# Patient Record
Sex: Female | Born: 1950
Health system: Southern US, Community
[De-identification: ages and names within clinical notes are randomized; demographics above are authoritative.]

## PROBLEM LIST (undated history)

## (undated) DIAGNOSIS — R569 Unspecified convulsions: Secondary | ICD-10-CM

## (undated) DIAGNOSIS — A389 Scarlet fever, uncomplicated: Secondary | ICD-10-CM

## (undated) DIAGNOSIS — F329 Major depressive disorder, single episode, unspecified: Secondary | ICD-10-CM

## (undated) DIAGNOSIS — I10 Essential (primary) hypertension: Secondary | ICD-10-CM

## (undated) DIAGNOSIS — Z8669 Personal history of other diseases of the nervous system and sense organs: Secondary | ICD-10-CM

## (undated) DIAGNOSIS — F32A Depression, unspecified: Secondary | ICD-10-CM

## (undated) DIAGNOSIS — T7840XA Allergy, unspecified, initial encounter: Secondary | ICD-10-CM

## (undated) DIAGNOSIS — M1990 Unspecified osteoarthritis, unspecified site: Secondary | ICD-10-CM

## (undated) DIAGNOSIS — H9313 Tinnitus, bilateral: Secondary | ICD-10-CM

## (undated) DIAGNOSIS — M797 Fibromyalgia: Secondary | ICD-10-CM

## (undated) HISTORY — PX: TONSILLECTOMY: SUR1361

## (undated) HISTORY — DX: Personal history of other diseases of the nervous system and sense organs: Z86.69

## (undated) HISTORY — DX: Major depressive disorder, single episode, unspecified: F32.9

## (undated) HISTORY — DX: Unspecified osteoarthritis, unspecified site: M19.90

## (undated) HISTORY — DX: Unspecified convulsions: R56.9

## (undated) HISTORY — DX: Essential (primary) hypertension: I10

## (undated) HISTORY — DX: Allergy, unspecified, initial encounter: T78.40XA

## (undated) HISTORY — DX: Tinnitus, bilateral: H93.13

## (undated) HISTORY — PX: BILATERAL CARPAL TUNNEL RELEASE: SHX6508

## (undated) HISTORY — DX: Scarlet fever, uncomplicated: A38.9

## (undated) HISTORY — PX: COLONOSCOPY: SHX174

## (undated) HISTORY — DX: Depression, unspecified: F32.A

## (undated) HISTORY — DX: Fibromyalgia: M79.7

## (undated) HISTORY — PX: FOOT SURGERY: SHX648

---

## 1994-08-03 HISTORY — PX: DENTAL SURGERY: SHX609

## 1999-06-05 ENCOUNTER — Ambulatory Visit (HOSPITAL_COMMUNITY): Admission: RE | Admit: 1999-06-05 | Discharge: 1999-06-05 | Payer: Self-pay | Admitting: Unknown Physician Specialty

## 1999-06-05 ENCOUNTER — Encounter: Payer: Self-pay | Admitting: Unknown Physician Specialty

## 2000-05-05 ENCOUNTER — Other Ambulatory Visit: Admission: RE | Admit: 2000-05-05 | Discharge: 2000-05-05 | Payer: Self-pay | Admitting: Obstetrics & Gynecology

## 2000-06-14 ENCOUNTER — Encounter: Payer: Self-pay | Admitting: Obstetrics & Gynecology

## 2000-06-14 ENCOUNTER — Encounter: Admission: RE | Admit: 2000-06-14 | Discharge: 2000-06-14 | Payer: Self-pay | Admitting: Obstetrics & Gynecology

## 2000-07-09 ENCOUNTER — Ambulatory Visit (HOSPITAL_COMMUNITY): Admission: RE | Admit: 2000-07-09 | Discharge: 2000-07-09 | Payer: Self-pay

## 2000-07-09 ENCOUNTER — Encounter: Payer: Self-pay | Admitting: Family Medicine

## 2000-08-03 HISTORY — PX: NOSE SURGERY: SHX723

## 2001-06-07 ENCOUNTER — Other Ambulatory Visit: Admission: RE | Admit: 2001-06-07 | Discharge: 2001-06-07 | Payer: Self-pay | Admitting: Obstetrics and Gynecology

## 2001-08-24 ENCOUNTER — Encounter: Admission: RE | Admit: 2001-08-24 | Discharge: 2001-10-03 | Payer: Self-pay | Admitting: Specialist

## 2002-07-03 ENCOUNTER — Other Ambulatory Visit: Admission: RE | Admit: 2002-07-03 | Discharge: 2002-07-03 | Payer: Self-pay | Admitting: Obstetrics and Gynecology

## 2003-05-17 ENCOUNTER — Ambulatory Visit (HOSPITAL_COMMUNITY): Admission: RE | Admit: 2003-05-17 | Discharge: 2003-05-17 | Payer: Self-pay | Admitting: Family Medicine

## 2003-05-17 ENCOUNTER — Encounter: Payer: Self-pay | Admitting: Family Medicine

## 2004-11-04 ENCOUNTER — Ambulatory Visit (HOSPITAL_COMMUNITY): Admission: RE | Admit: 2004-11-04 | Discharge: 2004-11-04 | Payer: Self-pay | Admitting: Family Medicine

## 2005-12-24 ENCOUNTER — Ambulatory Visit (HOSPITAL_COMMUNITY): Admission: RE | Admit: 2005-12-24 | Discharge: 2005-12-24 | Payer: Self-pay | Admitting: Family Medicine

## 2006-01-12 ENCOUNTER — Encounter: Admission: RE | Admit: 2006-01-12 | Discharge: 2006-01-12 | Payer: Self-pay | Admitting: Family Medicine

## 2006-01-18 ENCOUNTER — Ambulatory Visit: Payer: Self-pay | Admitting: Gastroenterology

## 2006-01-22 ENCOUNTER — Ambulatory Visit: Payer: Self-pay | Admitting: Gastroenterology

## 2006-01-27 ENCOUNTER — Other Ambulatory Visit: Admission: RE | Admit: 2006-01-27 | Discharge: 2006-01-27 | Payer: Self-pay | Admitting: Obstetrics and Gynecology

## 2008-01-02 ENCOUNTER — Ambulatory Visit (HOSPITAL_BASED_OUTPATIENT_CLINIC_OR_DEPARTMENT_OTHER): Admission: RE | Admit: 2008-01-02 | Discharge: 2008-01-02 | Payer: Self-pay | Admitting: Obstetrics and Gynecology

## 2008-01-18 ENCOUNTER — Other Ambulatory Visit: Admission: RE | Admit: 2008-01-18 | Discharge: 2008-01-18 | Payer: Self-pay | Admitting: Obstetrics and Gynecology

## 2009-06-24 ENCOUNTER — Ambulatory Visit: Payer: Self-pay | Admitting: Diagnostic Radiology

## 2009-06-24 ENCOUNTER — Ambulatory Visit (HOSPITAL_BASED_OUTPATIENT_CLINIC_OR_DEPARTMENT_OTHER): Admission: RE | Admit: 2009-06-24 | Discharge: 2009-06-24 | Payer: Self-pay | Admitting: Family Medicine

## 2011-01-06 ENCOUNTER — Encounter: Payer: Self-pay | Admitting: Gastroenterology

## 2011-05-20 ENCOUNTER — Other Ambulatory Visit (HOSPITAL_BASED_OUTPATIENT_CLINIC_OR_DEPARTMENT_OTHER): Payer: Self-pay | Admitting: Family Medicine

## 2011-05-20 DIAGNOSIS — Z1231 Encounter for screening mammogram for malignant neoplasm of breast: Secondary | ICD-10-CM

## 2011-05-22 ENCOUNTER — Encounter: Payer: Self-pay | Admitting: Gastroenterology

## 2011-05-22 ENCOUNTER — Ambulatory Visit (AMBULATORY_SURGERY_CENTER): Payer: Medicare Other | Admitting: *Deleted

## 2011-05-22 VITALS — Ht 59.25 in | Wt 176.0 lb

## 2011-05-22 DIAGNOSIS — Z1211 Encounter for screening for malignant neoplasm of colon: Secondary | ICD-10-CM

## 2011-05-22 MED ORDER — PEG-KCL-NACL-NASULF-NA ASC-C 100 G PO SOLR: ORAL | Status: DC

## 2011-05-25 ENCOUNTER — Ambulatory Visit (HOSPITAL_BASED_OUTPATIENT_CLINIC_OR_DEPARTMENT_OTHER)
Admission: RE | Admit: 2011-05-25 | Discharge: 2011-05-25 | Disposition: A | Discharge status: Home / Self Care | Payer: Medicare Other | Source: Ambulatory Visit | Attending: Family Medicine | Admitting: Family Medicine

## 2011-05-25 DIAGNOSIS — Z1231 Encounter for screening mammogram for malignant neoplasm of breast: Secondary | ICD-10-CM | POA: Insufficient documentation

## 2011-06-04 ENCOUNTER — Other Ambulatory Visit: Payer: Self-pay | Admitting: Gastroenterology

## 2011-06-15 ENCOUNTER — Ambulatory Visit (AMBULATORY_SURGERY_CENTER): Payer: Medicare Other | Admitting: Gastroenterology

## 2011-06-15 ENCOUNTER — Encounter: Payer: Self-pay | Admitting: Gastroenterology

## 2011-06-15 VITALS — BP 123/72 | HR 67 | Temp 97.3°F | Resp 16 | Ht 59.0 in | Wt 176.0 lb

## 2011-06-15 DIAGNOSIS — K649 Unspecified hemorrhoids: Secondary | ICD-10-CM

## 2011-06-15 DIAGNOSIS — K921 Melena: Secondary | ICD-10-CM

## 2011-06-15 DIAGNOSIS — Z1211 Encounter for screening for malignant neoplasm of colon: Secondary | ICD-10-CM

## 2011-06-15 DIAGNOSIS — Z86010 Personal history of colonic polyps: Secondary | ICD-10-CM

## 2011-06-15 MED ORDER — SODIUM CHLORIDE 0.9 % IV SOLN: 500.0000 mL | INTRAVENOUS | Status: DC

## 2011-06-15 NOTE — Progress Notes (Signed)
No complaints noted in the recovery room. maw 

## 2011-06-15 NOTE — Patient Instructions (Signed)
See the picture page for your findings from your exam today.  Follow the green and blue discharge instruction sheets the rest of the day.  Resume your prior medications today. Please call if any questions or concerns.  

## 2011-06-16 ENCOUNTER — Telehealth: Payer: Self-pay

## 2011-06-16 NOTE — Telephone Encounter (Signed)

## 2012-05-12 ENCOUNTER — Emergency Department: Payer: Self-pay | Admitting: Emergency Medicine

## 2012-05-12 LAB — CBC WITH DIFFERENTIAL/PLATELET
Basophil #: 0.1 10*3/uL (ref 0.0–0.1)
Eosinophil %: 5.9 %
HCT: 41.5 % (ref 35.0–47.0)
Lymphocyte #: 2 10*3/uL (ref 1.0–3.6)
MCH: 31.7 pg (ref 26.0–34.0)
MCV: 90 fL (ref 80–100)
Monocyte #: 0.7 x10 3/mm (ref 0.2–0.9)
Monocyte %: 8 %
RDW: 13.2 % (ref 11.5–14.5)
WBC: 8.1 10*3/uL (ref 3.6–11.0)

## 2012-05-12 LAB — BASIC METABOLIC PANEL
Anion Gap: 10 (ref 7–16)
BUN: 9 mg/dL (ref 7–18)
Chloride: 107 mmol/L (ref 98–107)
Creatinine: 0.79 mg/dL (ref 0.60–1.30)
EGFR (Non-African Amer.): >60
Potassium: 3.4 mmol/L — ABNORMAL LOW (ref 3.5–5.1)

## 2012-10-25 ENCOUNTER — Ambulatory Visit: Payer: Self-pay | Admitting: Orthopedic Surgery

## 2012-11-21 ENCOUNTER — Ambulatory Visit: Payer: Self-pay | Admitting: Orthopedic Surgery

## 2012-11-21 LAB — MRSA PCR SCREENING

## 2012-11-21 LAB — SEDIMENTATION RATE: Erythrocyte Sed Rate: 21 mm/hr (ref 0–30)

## 2012-12-06 ENCOUNTER — Inpatient Hospital Stay: Payer: Self-pay | Admitting: Nurse Practitioner

## 2012-12-07 LAB — HEMOGLOBIN: HGB: 10.6 g/dL — ABNORMAL LOW (ref 12.0–16.0)

## 2012-12-07 LAB — BASIC METABOLIC PANEL
Anion Gap: 5 — ABNORMAL LOW (ref 7–16)
Chloride: 106 mmol/L (ref 98–107)
EGFR (African American): >60
EGFR (Non-African Amer.): >60
Osmolality: 277 (ref 275–301)
Potassium: 3.2 mmol/L — ABNORMAL LOW (ref 3.5–5.1)

## 2012-12-08 LAB — PATHOLOGY REPORT

## 2012-12-10 LAB — BASIC METABOLIC PANEL
BUN: 8 mg/dL (ref 7–18)
Calcium, Total: 8.6 mg/dL (ref 8.5–10.1)
Chloride: 115 mmol/L — ABNORMAL HIGH (ref 98–107)
Co2: 22 mmol/L (ref 21–32)
Creatinine: 0.58 mg/dL — ABNORMAL LOW (ref 0.60–1.30)
EGFR (African American): >60
EGFR (Non-African Amer.): >60
Glucose: 118 mg/dL — ABNORMAL HIGH (ref 65–99)
Osmolality: 286 (ref 275–301)

## 2013-05-24 ENCOUNTER — Other Ambulatory Visit (HOSPITAL_BASED_OUTPATIENT_CLINIC_OR_DEPARTMENT_OTHER): Payer: Self-pay | Admitting: Family Medicine

## 2013-05-24 DIAGNOSIS — Z1231 Encounter for screening mammogram for malignant neoplasm of breast: Secondary | ICD-10-CM

## 2013-05-29 ENCOUNTER — Ambulatory Visit (HOSPITAL_BASED_OUTPATIENT_CLINIC_OR_DEPARTMENT_OTHER)
Admission: RE | Admit: 2013-05-29 | Discharge: 2013-05-29 | Disposition: A | Discharge status: Home / Self Care | Payer: Medicare Other | Source: Ambulatory Visit | Attending: Family Medicine | Admitting: Family Medicine

## 2013-05-29 DIAGNOSIS — Z1231 Encounter for screening mammogram for malignant neoplasm of breast: Secondary | ICD-10-CM | POA: Insufficient documentation

## 2013-06-01 ENCOUNTER — Ambulatory Visit: Payer: Self-pay | Admitting: Orthopedic Surgery

## 2013-06-20 ENCOUNTER — Ambulatory Visit: Payer: Self-pay | Admitting: Orthopedic Surgery

## 2013-06-20 LAB — URINALYSIS, COMPLETE
Bacteria: NONE SEEN
Bilirubin,UR: NEGATIVE
Blood: NEGATIVE
Glucose,UR: NEGATIVE mg/dL (ref 0–75)
Leukocyte Esterase: NEGATIVE
Ph: 6 (ref 4.5–8.0)
Squamous Epithelial: <1

## 2013-06-20 LAB — CBC
HCT: 41.5 % (ref 35.0–47.0)
HGB: 14.5 g/dL (ref 12.0–16.0)
MCH: 29.9 pg (ref 26.0–34.0)
RDW: 14.2 % (ref 11.5–14.5)
WBC: 11.1 10*3/uL — ABNORMAL HIGH (ref 3.6–11.0)

## 2013-06-20 LAB — BASIC METABOLIC PANEL
Anion Gap: 6 — ABNORMAL LOW (ref 7–16)
Calcium, Total: 9.3 mg/dL (ref 8.5–10.1)
Chloride: 105 mmol/L (ref 98–107)
Creatinine: 0.98 mg/dL (ref 0.60–1.30)
EGFR (Non-African Amer.): >60
Glucose: 106 mg/dL — ABNORMAL HIGH (ref 65–99)
Potassium: 3.4 mmol/L — ABNORMAL LOW (ref 3.5–5.1)
Sodium: 138 mmol/L (ref 136–145)

## 2013-06-20 LAB — PROTIME-INR
INR: 1.1
Prothrombin Time: 14.1 secs (ref 11.5–14.7)

## 2013-06-20 LAB — APTT: Activated PTT: 38.8 secs — ABNORMAL HIGH (ref 23.6–35.9)

## 2013-06-20 LAB — SEDIMENTATION RATE: Erythrocyte Sed Rate: 15 mm/hr (ref 0–30)

## 2013-06-20 LAB — MRSA PCR SCREENING

## 2013-07-04 ENCOUNTER — Inpatient Hospital Stay: Payer: Self-pay | Admitting: Orthopedic Surgery

## 2013-07-05 LAB — BASIC METABOLIC PANEL
Anion Gap: 7 (ref 7–16)
Calcium, Total: 8.1 mg/dL — ABNORMAL LOW (ref 8.5–10.1)
Co2: 25 mmol/L (ref 21–32)
Creatinine: 0.92 mg/dL (ref 0.60–1.30)
EGFR (African American): >60
EGFR (Non-African Amer.): >60
Potassium: 3.1 mmol/L — ABNORMAL LOW (ref 3.5–5.1)
Sodium: 140 mmol/L (ref 136–145)

## 2013-07-05 LAB — PLATELET COUNT: Platelet: 155 10*3/uL (ref 150–440)

## 2013-07-05 LAB — HEMOGLOBIN: HGB: 11.7 g/dL — ABNORMAL LOW (ref 12.0–16.0)

## 2013-07-06 LAB — PATHOLOGY REPORT

## 2013-08-03 HISTORY — PX: REPLACEMENT TOTAL KNEE: SUR1224

## 2014-04-06 ENCOUNTER — Encounter: Payer: Self-pay | Admitting: Gastroenterology

## 2014-06-28 IMAGING — CR DG KNEE 1-2V*L*
1 series · 1 of 1 positions shown · non-contrast
Comparison: CT left knee 06/01/2013.

CLINICAL DATA: Prior knee replacement.

EXAM:
LEFT KNEE - 1-2 VIEW

[lat]
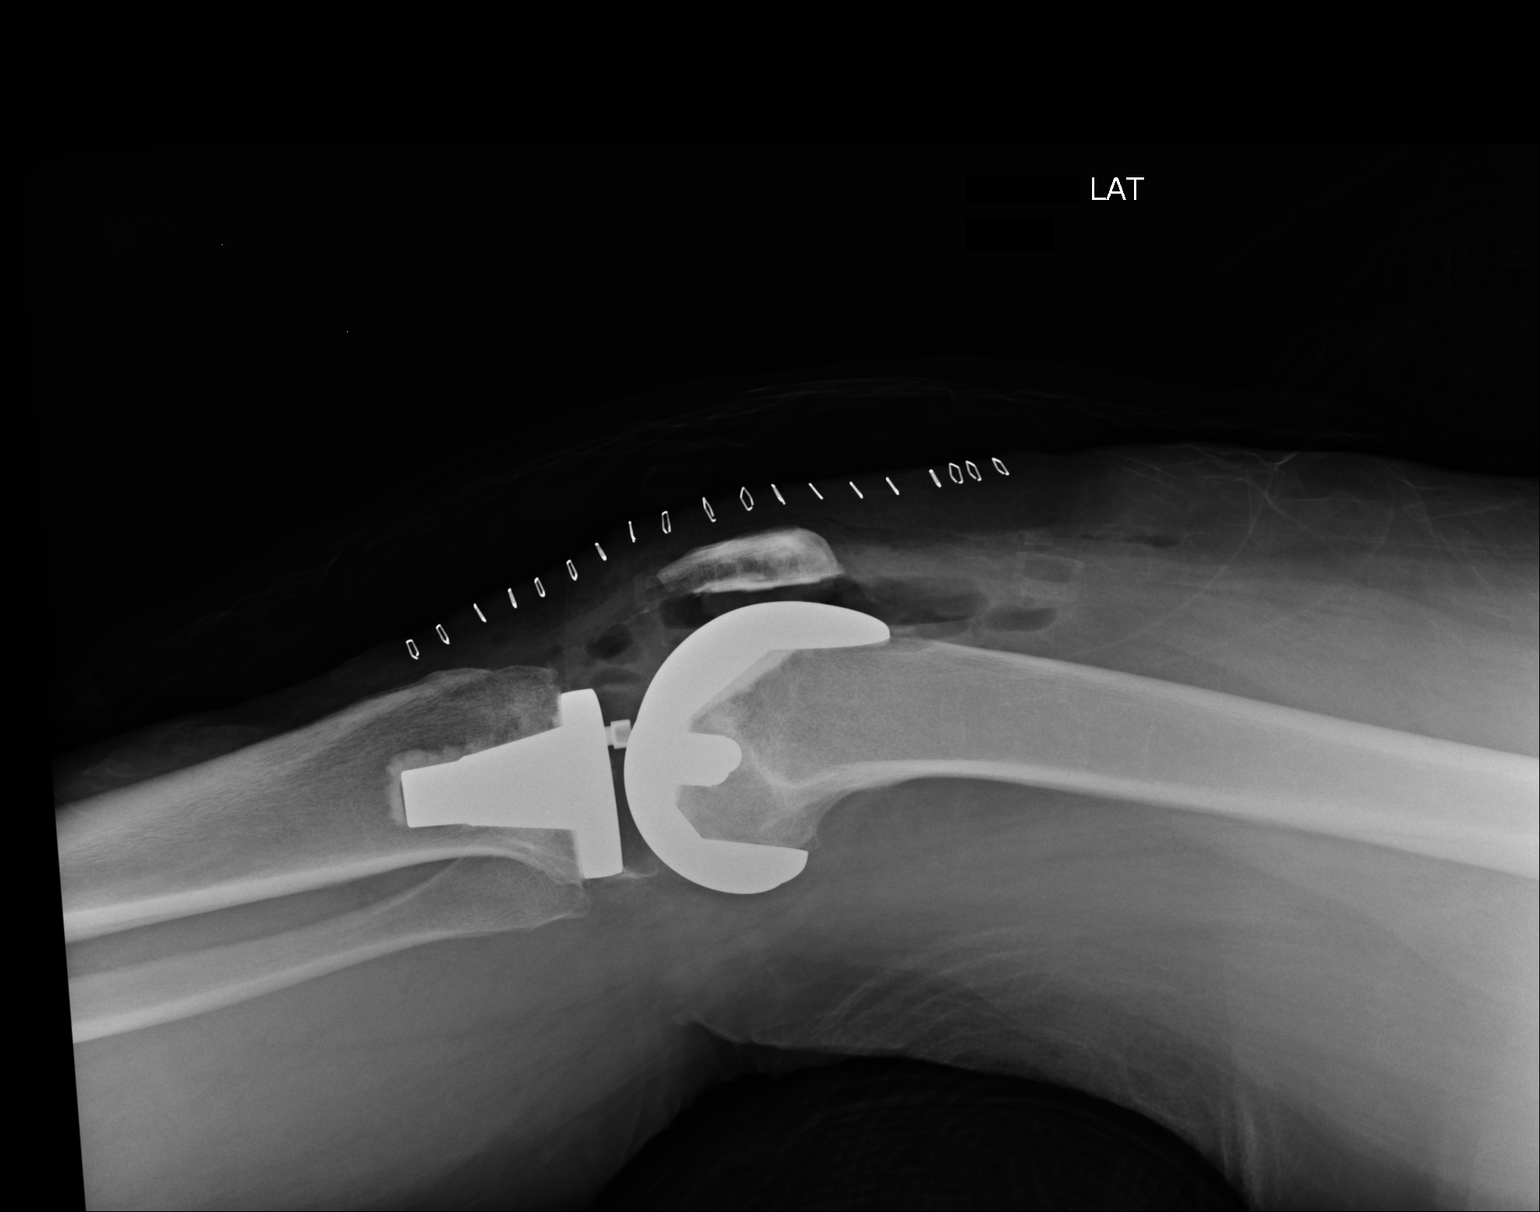

[1 of 1 positions shown; findings below may reference images not displayed]

FINDINGS: Patient is status post total left knee replacement with good
anatomic alignment. Surgical staples are noted. Air in soft tissues
noted from prior surgery.
IMPRESSION: Patient status post total left knee replacement with good anatomic
alignment.

## 2014-11-23 NOTE — Op Note (Signed)
PATIENT NAME:  Toni Bell, SCHLINK MR#:  [id number] DATE OF BIRTH:  07/19/1951  DATE OF PROCEDURE:  12/06/2012  PREOPERATIVE DIAGNOSIS: Right knee osteoarthritis.   POSTOPERATIVE DIAGNOSIS: Right knee osteoarthritis.   PROCEDURE: Right total knee replacement.   SURGEON: Laurene Footman, MD   ASSISTANT: April Berndt, Nurse Practitioner   ANESTHESIA: Spinal.   DESCRIPTION OF PROCEDURE: The patient was brought to the operating room, and after adequate spinal anesthesia was obtained, the right leg was prepped and draped in the usual sterile fashion with a tourniquet applied to the upper thigh. After patient identification and timeout procedures were performed, the leg was exsanguinated with an Esmarch and the tourniquet raised to 300 mmHg. A midline skin incision was made followed by a medial parapatellar arthrotomy. Inspection revealed significant medial and patellofemoral degenerative change with some exposed bone.  The lateral compartment had pitting of articular cartilage. The ACL and infrapatellar fat pad were excised. Next, the proximal tibia was exposed for application of the [place] cutting guide.  The proximal cutting guide was applied and alignment checked and  proximal tibia cut carried out along with excision of the menisci at this time. The femur was addressed in a similar fashion with removing articular cartilage first around the periphery to allow for application of the cutting guide.  The guide was applied.  The distal cut was made with drill holes to give correct femoral rotation.  The 5-in-1cutting block was applied to the distal femur, anterior, posterior and chamfer cuts carried out. The trials were placed, and the #1 fit well for the tibia, size 2 on the femur. The 10 mm spacer was initially applied with subsequently going up to a 12 to get good stability in mid flexion. After acceptable position on trials had been obtained, drill holes in the distal femur and notch cut for the trochlear  groove were applied.  Proximal drilling of the tibia and notch cut for rotation had also been carried out with the initial trial placement.  The patella was cut using the guide, and it sized to a #1 after drill 3 pegs were placed. At this point, the trial components were removed. The bony surface was thoroughly irrigated and dried prior to implantation of the cement.  Exparel diluted with saline was infiltrated around the joint. Next, the tibial component was cemented into place first, followed by the tibial  insert and placement of the femoral component.  The knee was then placed in extension and the patellar button clamped into place. After the cement had set, excess cement was removed. The tourniquet was let down.  The posterior geniculate artery had bleeding which could not be completely controlled with the implants in place, and so it was determined a wound VAC would be required to help minimize postop swelling. The wound was closed with a heavy quill suture for the capsule and 2-0 quill subcutaneously. The skin was closed with staples. Following this, Mepitel and wound VAC were applied. The patient was then sent to the recovery room in stable condition with a Polar Care device applied.   CONDITION TO RECOVERY ROOM: Stable.   ESTIMATED BLOOD LOSS: 600.   URINE OUTPUT: 100.   TOURNIQUET TIME: 70 minutes at 300 mmHg.   IMPLANTS: GMK primary femoral component, size right 2N, a size 1 right fixed tibial tray with a size 1 patella and a size 1, 12 mm UC tibial insert.   SPECIMENS: Cut ends of bone.   ____________________________ Laurene Footman, MD mjm:cb  D: 12/06/2012 17:41:10 ET T: 12/06/2012 20:07:59 ET JOB#: [id number]  cc: Laurene Footman, MD, <Dictator> Laurene Footman MD ELECTRONICALLY SIGNED 12/07/2012 8:06

## 2014-11-23 NOTE — Op Note (Signed)
PATIENT NAME:  Toni Bell, Toni Bell MR#:  [id number] DATE OF BIRTH:  [birth year]  DATE OF PROCEDURE:  07/04/2013  PREOPERATIVE DIAGNOSIS:  Left knee osteoarthritis.   POSTOPERATIVE DIAGNOSIS:  Left knee osteoarthritis.   PROCEDURE:  Left total knee replacement.   ANESTHESIA:  General.  SURGEON: Hessie Knows, M.D.  ASSISTANT:  Rachelle Hora, PA-C.   DESCRIPTION OF PROCEDURE: The patient was brought to the operating room and after adequate anesthesia was obtained, the left leg was prepped and draped in the usual sterile fashion with a tourniquet applied to the upper thigh. After patient identification and timeout procedures were completed, the leg was exsanguinated with an Esmarch and the tourniquet raised to 300 mmHg. A midline skin incision was made, followed by a medial parapatellar arthrotomy. Inspection of the knee revealed moderate synovitis. There is eburnated bone present on the lateral patellar facet and exposed bone on the corresponding lateral femoral trochlea with extensive spurring. There is exposed bone on the medial femoral condyle and tibial condyles. The lateral compartment had pitting of the articular cartilage. After the fat pad and ACL were excised, the proximal tibia was exposed to allow for application of the [place] cutting block. The [place] cutting block was applied and alignment checked and the proximal tibia cut carried out with bone removed. The cut matched the preop template.   Next, the femur was exposed to allow for application of the [place] femoral cutting guide with pins placed. The distal femoral cut was carried out and a 2 femoral cutting jig was applied. Anterior, posterior and chamfer cuts made. At this point, the residual horns of the menisci were excised along with some posterior spurs. The tibia sized to a size 1 and the tibial trial was placed. Rotation checked based on one of the initial pins placed from the block. It was pinned in place, proximal drill hole carried  out followed by the keel cut and the keel punch was left in place. The femoral trial was placed and with a 10 mm insert, there was full extension. Distal femoral drill holes were then made and the trochlear cut is then carried out with a trochlear cutting guide. The patella was cut using the patellar cutting guide and after the drill holes were made, it sized to a size 1. At this point, the knee was thoroughly irrigated. Hemostasis checked with electrocautery. The knee was injected with Exparel diluted with saline in the periarticular tissues along with a combination of morphine and 0.25% Sensorcaine with epinephrine. Following this, the bony surfaces were dried and the final components placed for cementing the tibial component in place, placing the patellar insert with locking screw, followed by the femoral component. The knee was held in extension and the patellar button was clamped into place with all components cemented. After the cement had set, excess cement was removed. There was excellent patellofemoral tracking. The knee was again thoroughly irrigated and the tourniquet let down. The arthrotomy was repaired using a heavy Quill suture followed by 2-0 Quill subcutaneously and skin staples. Xeroform, 4 x 4's, Webril and Ace wrap applied along with Polar Care and knee immobilizer. The patient was sent to the recovery room in stable condition.   ESTIMATED BLOOD LOSS:  50 mL.   COMPLICATIONS:  None.   SPECIMEN:  Cut ends of bone.  TOURNIQUET TIME:  57 minutes at 300 mmHg.  IMPLANTS:  Medacta GMK sphere, size 2 femur, 1 tibia, 10 mm insert with a size 1 patella.   ____________________________ Legrand Como  Arnell Asal, MD mjm:ce D: 07/04/2013 22:53:48 ET T: 07/04/2013 23:38:12 ET JOB#: [id number]  cc: Laurene Footman, MD, <Dictator> Laurene Footman MD ELECTRONICALLY SIGNED 07/05/2013 8:13

## 2014-11-23 NOTE — Discharge Summary (Signed)
PATIENT NAME:  Toni Bell, Toni Bell MR#:  [id number] DATE OF BIRTH:  [birth year]  DATE OF ADMISSION:  07/04/2013 DATE OF DISCHARGE:  07/07/2013  ADMITTING DIAGNOSIS: Degenerative arthritis, left knee.   DISCHARGE DIAGNOSIS: Degenerative arthritis, left knee.   OPERATION: On 07/04/2013, she had a left total knee arthroplasty.   ANESTHESIA: General.   ESTIMATED BLOOD LOSS: 50 mL.  TOURNIQUET TIME: 57 minutes at 300 mmHg.   DRAINS: None.   IMPLANTS: Medacta GMK sphere, 2 femur, 1 tibia, 10 mm insert, 1 patella.   COMPLICATIONS: None.   HISTORY: Toni Bell is a 64 year old female who failed conservative measures and treatment for her left knee osteoarthritis. She consented for an elective left total knee arthroplasty.   PHYSICAL EXAMINATION:  LUNGS: Clear.  HEART: Regular rate and rhythm.  HEENT: Normal.  MUSCULOSKELETAL: Left knee range of motion 25 to 105. NEUROVASCULAR: Intact.  HOSPITAL COURSE: After initial admission on 07/04/2013 the patient underwent a left total knee arthroplasty the same day. She had good pain control afterwards and was then transferred to the orthopedic floor from the PACU. Her initial hemoglobin was 11.7. She was hypokalemic at 3.1. 40 mEq of potassium was given IV that day and a potassium was rechecked later that day, which was 3.8. She began physical therapy on that day with slow progress. On postoperative day two, 07/06/2013, she continued to have small progress with physical therapy. On postoperative day three, 07/07/2013, she was stable and ready for discharge. She ambulated 200 feet with physical therapy the day before and had a bowel movement.   CONDITION AT DISCHARGE: Stable.   DISPOSITION: The patient is sent home with home health.   DISCHARGE INSTRUCTIONS: The patient will follow up in [facility] orthopedics in 2 weeks for staple removal. She will do physical therapy and weight bear as tolerated on the left lower extremity. She will have a regular  diet. TED hose thigh high bilaterally. She has an Aquacel dressing placed which should not be changed until she comes for her follow-up appointment.   DISCHARGE MEDICATIONS: Please see discharge instructions for a complete list of discharge medications. ____________________________ Timera Windt M. Tretha Sciara, NP amb:sb D: 07/07/2013 08:04:16 ET T: 07/07/2013 08:41:35 ET JOB#: [id number]  cc: Seairra Otani M. Tretha Sciara, NP, <Dictator> Kem Kays Ishani Goldwasser FNP ELECTRONICALLY SIGNED 07/10/2013 8:25

## 2014-11-23 NOTE — Discharge Summary (Signed)
PATIENT NAME:  Toni Bell, Toni Bell MR#:  [id number] DATE OF BIRTH:  [birth year]  DATE OF ADMISSION:  12/06/2012 DATE OF DISCHARGE:  12/09/2012  ADMITTING DIAGNOSIS: Right knee osteoarthritis.   POSTOPERATIVE DIAGNOSIS: Right knee osteoarthritis.   OPERATION: On 12/06/2012, she had a right total knee arthroplasty.   SURGEON: Hessie Knows, M.D.   ANESTHESIA: Spinal.   ESTIMATED BLOOD LOSS: 600 mL.  URINE OUTPUT: 100 mL.  TOURNIQUET TIME: 70 minutes at 300 mmHg.   IMPLANTS: GMK primary femoral component size 2N, size 1 right fixed tibial tray with size 1 patella and size 1, 12 mm UC tibial insert.  COMPLICATIONS: None.   HISTORY: Ms. Fernandez is a 64 year old who has exhausted nonoperative management with injection, bracing and anti-inflammatories without relief and consented for a total knee arthroplasty.   PHYSICAL EXAMINATION: HEENT: Remarkable for upper partial dentures.  LUNGS: Clear to auscultation.  HEART: Regular rhythm without murmur.  RIGHT KNEE:  Range of motion 5 to 100 with varus deformity that is passively correctable. Moderate swelling to the knee with a Baker's cyst present. Distally has palpable dorsal pedis and posterior tibialis pulses. No clonus, cyanosis or edema. She is able to flex and extend the toes and ankle.   HOSPITAL COURSE: After admission, on 12/06/2012, she underwent right total knee arthroplasty the same day. Her pain was controlled and she was brought to the orthopedic floor from the PACU. Her initial hemoglobin on postoperative day 1, 12/07/2012, was 10.6. She had a potassium of 3.2.  Her potassium was replaced, but potassium was not available for review on morning rounding, on postoperative day 2, 12/08/2012.  On 12/09/2012, a recent potassium was 2.8.  Her potassium was increased as she was already on 10 milliequivalents daily. Physical therapy was begun on that day. On postoperative day 2, 12/08/2012, she had limited progress with physical therapy. She was  refusing pain meds, stool softener and has shortness of breath while sitting in a recliner. T-max that day was 100.9. She did not have any labs drawn that day.  She is stable for discharge to rehab.   CONDITION AT DISCHARGE: Stable.   DISPOSITION: The patient was sent to rehab.   DISCHARGE INSTRUCTIONS: The patient will follow up with [facility] orthopedics in 2 weeks for staple removal. She will do physical therapy and weight bear as tolerated on the right lower extremity.  Diet is regular. TED hose knee-high bilaterally. Dressing can be changed once daily and on an as-needed basis.   DISCHARGE MEDICATIONS: 1.  Centrum Silver 1 tab p.o. at bedtime.  2.  Calcium carbonate/simethicone 1000/60 mg tablet chewable, 1 tab p.o. once daily p.r.n. 3.  Cephalexin 500 mg capsule 1 cap p.o. t.i.d.  4.  Cyclobenzaprine 10 mg 1 tab p.o. t.i.d. p.r.n. muscle spasm.  5.  Gabapentin 100 mg 3 caps p.o. at bedtime.  6.  Clonazepam 1 mg 1 tab p.o. once daily at bedtime.  7.  Aspirin 81 mg 1 tab p.o. at bedtime.  8.  Bisoprolol/HCTZ 2.5/6.25 mg 1 tab p.o. at bedtime.  9.  Baclofen 10 mg 2 tabs p.o. at bedtime.  10.  Fluoxetine 20 mg oral capsule 1 cap p.o. at bedtime.  11.  Seroquel 100 mg 1-1/2 tabs p.o. at bedtime.  12.  Calcium 600 plus D 1 tab p.o. at bedtime.  13.  Probiotic formula 1 cap p.o. once daily in the morning.  14.  Topamax 100 mg 1 tab p.o. at bedtime.  15.  Allegra 180 mg  1 tab p.o. at bedtime.  16.  Clobetasol topical 0.05% topical cream apply topically to affected area 2 times a day p.r.n. 17.  Sine-Off maximum strength 2 tabs p.o. once daily p.r.n.  18.  Multivitamin with minerals 1 tab p.o. once daily.  19.  Ibuprofen 400 mg every 6 hours p.r.n. pain.  20.  Acetaminophen 500 mg 1 tab p.o. q. 4 hours p.r.n. pain or temperature greater than 100.4.  21.  Oxycodone 5 mg oral tablet 1 tab p.o. q. 4 hours p.r.n. pain.  22.  Sumatriptan 50 mg 1 tab p.o. q. 12 hours p.r.n. headache.  23.   Magnesium hydroxide 8% oral suspension 30 mL orally b.i.d. p.r.n. constipation.  24.  Rivaroxaban 10 mg oral tablet 1 tab p.o. q. a.m. x 9 days. 25.  Zolpidem 5 mg 1 tab p.o. at bedtime p.r.n. insomnia.  26.  Bisacodyl 10 mg rectal suppository 1 suppository per rectum once daily p.r.n. constipation.  27.  Docusate/senna 50//8.6 mg oral tablet 1 tab p.o. b.i.d.  ____________________________ Chaunta Bejarano M. Tretha Sciara, NP amb:sb D: 12/09/2012 12:26:48 ET T: 12/09/2012 12:58:14 ET JOB#: [id number]  cc: Trinna Kunst M. Tretha Sciara, NP, <Dictator> Kem Kays Keilana Morlock FNP ELECTRONICALLY SIGNED 12/16/2012 14:01

## 2015-03-29 ENCOUNTER — Other Ambulatory Visit (HOSPITAL_BASED_OUTPATIENT_CLINIC_OR_DEPARTMENT_OTHER): Payer: Self-pay | Admitting: Family Medicine

## 2015-03-29 DIAGNOSIS — Z1231 Encounter for screening mammogram for malignant neoplasm of breast: Secondary | ICD-10-CM

## 2015-04-12 ENCOUNTER — Ambulatory Visit (HOSPITAL_BASED_OUTPATIENT_CLINIC_OR_DEPARTMENT_OTHER): Payer: Self-pay

## 2015-12-03 DIAGNOSIS — Z0000 Encounter for general adult medical examination without abnormal findings: Secondary | ICD-10-CM | POA: Diagnosis not present

## 2015-12-10 DIAGNOSIS — E785 Hyperlipidemia, unspecified: Secondary | ICD-10-CM | POA: Diagnosis not present

## 2015-12-10 DIAGNOSIS — Z0000 Encounter for general adult medical examination without abnormal findings: Secondary | ICD-10-CM | POA: Diagnosis not present

## 2015-12-10 DIAGNOSIS — I10 Essential (primary) hypertension: Secondary | ICD-10-CM | POA: Diagnosis not present

## 2016-05-12 ENCOUNTER — Encounter: Payer: Self-pay | Admitting: Gastroenterology

## 2016-05-20 ENCOUNTER — Ambulatory Visit (AMBULATORY_SURGERY_CENTER): Payer: Self-pay | Admitting: *Deleted

## 2016-05-20 ENCOUNTER — Encounter: Payer: Self-pay | Admitting: Gastroenterology

## 2016-05-20 ENCOUNTER — Encounter (INDEPENDENT_AMBULATORY_CARE_PROVIDER_SITE_OTHER): Payer: Self-pay

## 2016-05-20 VITALS — Ht 59.0 in | Wt 175.4 lb

## 2016-05-20 DIAGNOSIS — Z86010 Personal history of colonic polyps: Secondary | ICD-10-CM

## 2016-05-20 MED ORDER — NA SULFATE-K SULFATE-MG SULF 17.5-3.13-1.6 GM/177ML PO SOLN: 1.0000 | Freq: Once | ORAL | 0 refills | Status: AC

## 2016-05-20 NOTE — Progress Notes (Signed)
No allergies to eggs or soy. No problems with anesthesia.  Pt not given Emmi instructions for colonoscopy  No oxygen use  No diet drug use

## 2016-05-27 ENCOUNTER — Encounter: Payer: Self-pay | Admitting: Gastroenterology

## 2016-05-27 ENCOUNTER — Ambulatory Visit (AMBULATORY_SURGERY_CENTER): Payer: PPO | Admitting: Gastroenterology

## 2016-05-27 VITALS — BP 126/66 | HR 65 | Temp 97.8°F | Resp 15 | Ht 59.0 in | Wt 175.0 lb

## 2016-05-27 DIAGNOSIS — K649 Unspecified hemorrhoids: Secondary | ICD-10-CM | POA: Diagnosis not present

## 2016-05-27 DIAGNOSIS — D124 Benign neoplasm of descending colon: Secondary | ICD-10-CM

## 2016-05-27 DIAGNOSIS — R195 Other fecal abnormalities: Secondary | ICD-10-CM | POA: Diagnosis not present

## 2016-05-27 DIAGNOSIS — D123 Benign neoplasm of transverse colon: Secondary | ICD-10-CM

## 2016-05-27 DIAGNOSIS — Z86010 Personal history of colonic polyps: Secondary | ICD-10-CM | POA: Diagnosis not present

## 2016-05-27 DIAGNOSIS — I10 Essential (primary) hypertension: Secondary | ICD-10-CM | POA: Diagnosis not present

## 2016-05-27 DIAGNOSIS — D214 Benign neoplasm of connective and other soft tissue of abdomen: Secondary | ICD-10-CM | POA: Diagnosis not present

## 2016-05-27 MED ORDER — SODIUM CHLORIDE 0.9 % IV SOLN: 500.0000 mL | INTRAVENOUS | Status: AC

## 2016-05-27 NOTE — Patient Instructions (Signed)
YOU HAD AN ENDOSCOPIC PROCEDURE TODAY AT THE [facility]:   Refer to the procedure report that was given to you for any specific questions about what was found during the examination.  If the procedure report does not answer your questions, please call your gastroenterologist to clarify.  If you requested that your care partner not be given the details of your procedure findings, then the procedure report has been included in a sealed envelope for you to review at your convenience later.  YOU SHOULD EXPECT: Some feelings of bloating in the abdomen. Passage of more gas than usual.  Walking can help get rid of the air that was put into your GI tract during the procedure and reduce the bloating. If you had a lower endoscopy (such as a colonoscopy or flexible sigmoidoscopy) you may notice spotting of blood in your stool or on the toilet paper. If you underwent a bowel prep for your procedure, you may not have a normal bowel movement for a few days.  Please Note:  You might notice some irritation and congestion in your nose or some drainage.  This is from the oxygen used during your procedure.  There is no need for concern and it should clear up in a day or so.  SYMPTOMS TO REPORT IMMEDIATELY:   Following lower endoscopy (colonoscopy or flexible sigmoidoscopy):  Excessive amounts of blood in the stool  Significant tenderness or worsening of abdominal pains  Swelling of the abdomen that is new, acute  Fever of 100F or higher    For urgent or emergent issues, a gastroenterologist can be reached at any hour by calling [phone].   DIET:  We do recommend a small meal at first, but then you may proceed to your regular diet.  Drink plenty of fluids but you should avoid alcoholic beverages for 24 hours.  ACTIVITY:  You should plan to take it easy for the rest of today and you should NOT DRIVE or use heavy machinery until tomorrow (because of the sedation medicines used during the test).     FOLLOW UP: Our staff will call the number listed on your records the next business day following your procedure to check on you and address any questions or concerns that you may have regarding the information given to you following your procedure. If we do not reach you, we will leave a message.  However, if you are feeling well and you are not experiencing any problems, there is no need to return our call.  We will assume that you have returned to your regular daily activities without incident.  If any biopsies were taken you will be contacted by phone or by letter within the next 1-3 weeks.  Please call us at [phone] if you have not heard about the biopsies in 3 weeks.    SIGNATURES/CONFIDENTIALITY: You and/or your care partner have signed paperwork which will be entered into your electronic medical record.  These signatures attest to the fact that that the information above on your After Visit Summary has been reviewed and is understood.  Full responsibility of the confidentiality of this discharge information lies with you and/or your care-partner.   Resume medications. Information given on polyps and hemorrhoids. 

## 2016-05-27 NOTE — Progress Notes (Signed)
Called to room to assist during endoscopic procedure.  Patient ID and intended procedure confirmed with present staff. Received instructions for my participation in the procedure from the performing physician.  

## 2016-05-27 NOTE — Progress Notes (Signed)
To PACU  Awake and alert.  Report to RN 

## 2016-05-27 NOTE — Op Note (Signed)
[place] Patient Name: Toni Bell Procedure Date: 05/27/2016 11:22 AM MRN: [id number] Endoscopist: Ladene Artist , MD Age: 65 Referring MD:  Date of Birth: 04-03-1951 Gender: Female Account #: [id number] Procedure:                Colonoscopy Indications:              Surveillance: Personal history of adenomatous                            polyps on last colonoscopy > 5 years ago Medicines:                Monitored Anesthesia Care Procedure:                Pre-Anesthesia Assessment:                           - Prior to the procedure, a History and Physical                            was performed, and patient medications and                            allergies were reviewed. The patient's tolerance of                            previous anesthesia was also reviewed. The risks                            and benefits of the procedure and the sedation                            options and risks were discussed with the patient.                            All questions were answered, and informed consent                            was obtained. Prior Anticoagulants: The patient has                            taken no previous anticoagulant or antiplatelet                            agents. ASA Grade Assessment: II - A patient with                            mild systemic disease. After reviewing the risks                            and benefits, the patient was deemed in                            satisfactory condition to undergo the procedure.                           -  Prior to the procedure, a History and Physical                            was performed, and patient medications and                            allergies were reviewed. The patient's tolerance of                            previous anesthesia was also reviewed. The risks                            and benefits of the procedure and the sedation                            options and risks were discussed  with the patient.                            All questions were answered, and informed consent                            was obtained. Prior Anticoagulants: The patient has                            taken no previous anticoagulant or antiplatelet                            agents. ASA Grade Assessment: II - A patient with                            mild systemic disease. After reviewing the risks                            and benefits, the patient was deemed in                            satisfactory condition to undergo the procedure.                           After obtaining informed consent, the colonoscope                            was passed under direct vision. Throughout the                            procedure, the patient's blood pressure, pulse, and                            oxygen saturations were monitored continuously. The                            Model PCF-[id number]) scope was introduced  through the anus and advanced to the the cecum,                            identified by appendiceal orifice and ileocecal                            valve. The ileocecal valve, appendiceal orifice,                            and rectum were photographed. The quality of the                            bowel preparation was adequate after extensive                            lavage and suctioning. The colonoscopy was                            performed without difficulty. The patient tolerated                            the procedure well. Scope In: 11:34:39 AM Scope Out: 11:48:45 AM Scope Withdrawal Time: 0 hours 11 minutes 24 seconds  Total Procedure Duration: 0 hours 14 minutes 6 seconds  Findings:                 The perianal and digital rectal examinations were                            normal.                           A 5 mm polyp was found in the descending colon. The                            polyp was sessile. The polyp was removed with a                             cold biopsy forceps. Resection and retrieval were                            complete.                           Internal hemorrhoids were found during                            retroflexion. The hemorrhoids were small and Grade                            I (internal hemorrhoids that do not prolapse).                           The exam was otherwise without abnormality on  direct and retroflexion views. Complications:            No immediate complications. Estimated blood loss:                            None. Estimated Blood Loss:     Estimated blood loss: none. Impression:               - One 5 mm polyp in the descending colon, removed                            with a cold biopsy forceps. Resected and retrieved.                           - Internal hemorrhoids.                           - The examination was otherwise normal on direct                            and retroflexion views. Recommendation:           - Repeat colonoscopy in 5 years for surveillance                            with a more extensive bowel prep.                           - Patient has a contact number available for                            emergencies. The signs and symptoms of potential                            delayed complications were discussed with the                            patient. Return to normal activities tomorrow.                            Written discharge instructions were provided to the                            patient.                           - Resume previous diet.                           - Continue present medications.                           - Await pathology results. Ladene Artist, MD 05/27/2016 11:53:12 AM This report has been signed electronically.

## 2016-05-28 ENCOUNTER — Telehealth: Payer: Self-pay | Admitting: *Deleted

## 2016-05-28 NOTE — Telephone Encounter (Signed)
  Follow up Call-  Call back number 05/27/2016  Post procedure Call Back phone  # [phone] cell  Permission to leave phone message Yes  Some recent data might be hidden     Patient questions:  Do you have a fever, pain , or abdominal swelling? No. Pain Score  0 *  Have you tolerated food without any problems? Yes.    Have you been able to return to your normal activities? Yes.    Do you have any questions about your discharge instructions: Diet   No. Medications  No. Follow up visit  No.  Do you have questions or concerns about your Care? No.  Actions: * If pain score is 4 or above: No action needed, pain <4.

## 2016-06-10 ENCOUNTER — Encounter: Payer: Self-pay | Admitting: Gastroenterology

## 2016-09-03 DIAGNOSIS — N390 Urinary tract infection, site not specified: Secondary | ICD-10-CM | POA: Diagnosis not present

## 2016-09-03 DIAGNOSIS — J019 Acute sinusitis, unspecified: Secondary | ICD-10-CM | POA: Diagnosis not present

## 2016-09-03 DIAGNOSIS — R399 Unspecified symptoms and signs involving the genitourinary system: Secondary | ICD-10-CM | POA: Diagnosis not present

## 2016-09-03 DIAGNOSIS — B9689 Other specified bacterial agents as the cause of diseases classified elsewhere: Secondary | ICD-10-CM | POA: Diagnosis not present

## 2016-11-09 ENCOUNTER — Other Ambulatory Visit: Payer: Self-pay | Admitting: Family Medicine

## 2016-11-09 DIAGNOSIS — Z1231 Encounter for screening mammogram for malignant neoplasm of breast: Secondary | ICD-10-CM

## 2016-11-30 DIAGNOSIS — Z01419 Encounter for gynecological examination (general) (routine) without abnormal findings: Secondary | ICD-10-CM | POA: Diagnosis not present

## 2016-11-30 DIAGNOSIS — H8110 Benign paroxysmal vertigo, unspecified ear: Secondary | ICD-10-CM | POA: Diagnosis not present

## 2016-11-30 DIAGNOSIS — Z1211 Encounter for screening for malignant neoplasm of colon: Secondary | ICD-10-CM | POA: Diagnosis not present

## 2016-12-01 ENCOUNTER — Ambulatory Visit: Payer: PPO

## 2016-12-10 DIAGNOSIS — I10 Essential (primary) hypertension: Secondary | ICD-10-CM | POA: Diagnosis not present

## 2016-12-10 DIAGNOSIS — Z0000 Encounter for general adult medical examination without abnormal findings: Secondary | ICD-10-CM | POA: Diagnosis not present

## 2016-12-10 DIAGNOSIS — Z23 Encounter for immunization: Secondary | ICD-10-CM | POA: Diagnosis not present

## 2016-12-10 DIAGNOSIS — H8110 Benign paroxysmal vertigo, unspecified ear: Secondary | ICD-10-CM | POA: Diagnosis not present

## 2016-12-14 DIAGNOSIS — I10 Essential (primary) hypertension: Secondary | ICD-10-CM | POA: Diagnosis not present

## 2017-01-01 ENCOUNTER — Ambulatory Visit: Payer: PPO | Attending: Family Medicine | Admitting: Physical Therapy

## 2017-01-01 ENCOUNTER — Encounter: Payer: Self-pay | Admitting: Physical Therapy

## 2017-01-01 DIAGNOSIS — R42 Dizziness and giddiness: Secondary | ICD-10-CM

## 2017-01-01 NOTE — Therapy (Addendum)
[place] MAIN [facility] [address] [place], [place], [address] Phone: [phone]   Fax:  [phone]  Physical Therapy Evaluation  Patient Details  Name: Toni Bell MRN: [id number] Date of Birth: 1950-12-23 Referring Provider: Dr. Kary Kos   Encounter Date: 01/01/2017      PT End of Session - 01/01/17 6010    Visit Number 1   Number of Visits 9   Date for PT Re-Evaluation 02/26/17   PT Start Time 0824   PT Stop Time 0950   PT Time Calculation (min) 86 min   Equipment Utilized During Treatment Gait belt   Activity Tolerance Patient tolerated treatment well   Behavior During Therapy [facility] for tasks assessed/performed      Past Medical History:  Diagnosis Date  . Allergy    103 environmental allergies  . Arthritis   . Depression   . Fibromyalgia   . History of migraine headaches   . Hypertension   . Osteoporosis   . Scarlet fever   . Seizures ([place])    last seizure when pt was 66 years old; reaction to Compazine  . Tinnitus of both ears     Past Surgical History:  Procedure Laterality Date  . BILATERAL CARPAL TUNNEL RELEASE Bilateral 1990, 2014  . [place], 1986   x 3  . COLONOSCOPY    . DENTAL SURGERY  1996   implants 4 on bottom  3 on top  . FOOT SURGERY Right    right great toe  titanium rod in it  . NOSE SURGERY  2002   polyps and bone removed  . REPLACEMENT TOTAL KNEE Bilateral 2015  . TONSILLECTOMY      There were no vitals filed for this visit.   Subjective       Subjective Assessment - 01/01/17 0959    Subjective Patient states that she has been sleeping practically upright on her couch and has not been exercising and doing as much due to concern that her symptoms might be brought on. Patient states that she has a slight headache today that started prior to coming in for the evaluation.    Pertinent History Patient reports that at end of 2015 or beginning of 2016 she went in for  an xray and when she went to sit up from lying on the table, this is when she had the first major episode of dizziness. Patient felt like she was drunk, weaving, veering and whoozy. Patient denies vertigo when she is having episodes. Patient states that she felt movement around her body and she experienced nausea and vomiting. Patient reports she has had 5 really bad episodes since that time. Patient states she is usually sick in bed each time and states she cannot walk, driving or cook. Patient states she does not feel like is going to fall down, but states she needs to hold onto objects for support. Patient states her symptoms get worse if she lies down flat. Patient states the last episode occurred in May of this year when she went to the [facility] for an internal ultrasound procedure and she experienced an episode while lying down despite being propped on 4 pillows. Patient states she has not been able to sleep in her bed since May 10th. Patient states she has been sleeping on the sofa with large pillows behind her back so that she is sleeping in near upright position. Patient states she gets an acute headache at the  onset of these episodes and states the headache lasts a day or two and states that her blood pressure will then increase as well. Patient reports that she has a history of migraines that began when she was 66 years old. Patient states she got Botox injections for 8 years for the headaches and states she found this to be very helpful and effective. Patient states she stopped getting Botox due to finances. Patient reports that some of her migraine triggers include weather, odors, stress, food and allergies and that she gets migraines separate from these episodes of dizziness. Patient has not been to an ENT physician in regards to her dizziness symptoms. Patient was treated by a neurologist for 9 years in the early 2000s for care of her migraines. Patient states she has not had any recent CT or  MRI of brain testing. Patient reports that aggrevating factors are lying down is the only thing that brings on these episodes of intense dizziness for days reports the patient; pt reports that since this most recent episode though she states that head turns, body turns bending over and looking up are now bringing on minor sensation of dizziness, but states it is "very mild" and does not recreate the same intensity of dizziness that she experiences when she has an episode.       Currently in Pain? Yes   Pain Location Head   Pain Type Chronic pain   Pain Onset Today   Pain Frequency --  reports daily headaches        VESTIBULAR AND BALANCE EVALUATION  Onset Date: 2.5 years ago with most recent episode 12/10/2016  HISTORY:  Subjective history of current problem: Patient reports that at end of 2015 or beginning of 2016 she went in for an xray and when she went to sit up from lying on the table, this is when she had the first major episode of dizziness. Patient felt like she was drunk, weaving, veering and whoozy. Patient denies vertigo when she is having episodes. Patient states that she felt movement around her body and she experienced nausea and vomiting. Patient reports she has had 5 really bad episodes since that time. Patient states she is usually sick in bed each time and states she cannot walk, driving or cook. Patient states she does not feel like is going to fall down, but states she needs to hold onto objects for support. Patient states her symptoms get worse if she lies down flat. Patient states the last episode occurred in May of this year when she went to the [facility] for an internal ultrasound procedure and she experienced an episode while lying down despite being propped on 4 pillows. Patient states she has not been able to sleep in her bed since May 10th. Patient states she has been sleeping on the sofa with large pillows behind her back so that she is sleeping in near upright  position. Patient states she gets an acute headache at the onset of these episodes and states the headache lasts a day or two and states that her blood pressure will then increase as well. Patient reports that she has a history of migraines that began when she was 66 years old. Patient states she got Botox injections for 8 years for the headaches and states she found this to be very helpful and effective. Patient states she stopped getting Botox due to finances. Patient reports that some of her migraine triggers include weather, odors, stress, food and allergies and that  she gets migraines separate from these episodes of dizziness. Patient has not been to an ENT physician in regards to her dizziness symptoms. Patient was treated by a neurologist for 9 years in the early 2000s for care of her migraines. Patient states she has not had any recent CT or MRI of brain testing.    Description of dizziness: unsteadiness, whoozy, "drunken feeling", lightheadedness only when it initially starts, swaying, veering Frequency: 5 times in the last 2 years or so Duration: 7 days patient states she  Symptom nature: positional, spontaneous, variable   Provocative Factors: lying down is the only thing that brings on these episodes of intense dizziness for days reports the patient; pt reports that since this most recent episode though she states that head turns, body turns bending over and looking up are now bringing on minor sensation of dizziness, but states it is "very mild" and does not recreate the same intensity and quality of dizziness that she experiences when she has a full blown episode.  Easing Factors: rest, medication- "Zofran stops it"   Progression of symptoms: (better, worse, no change since onset)  worse History of similar episodes: no episodes prior to 2015  Falls (yes/no): no Number of falls in past 6 months: no, but reports that she holds onto objects for support for balance when she is having an  active episode.   Prior Functional Level: Patient is on disability secondary to her migraines and fibromyalgia. Patient drives when she is not having episodes of dizziness, is independent community ambulator and independent with ADLs.   Auditory complaints (tinnitus, pain, drainage): tinnitus chronic bilaterally Vision (last eye exam, diplopia, recent changes): none  Current Symptoms: Review of systems negative for red flags.   EXAMINATION  POSTURE:  Rounded shoulders      COORDINATION: Finger to Nose:  Normal Past Pointing:   Normal   MUSCULOSKELETAL SCREEN: Cervical Spine ROM: Decreased AROM cervical spine extension and left rotation but WFL. Patient reports stiffness in neck with extension.   Functional Mobility: independent with sit to stand  Gait: Patient arrives to clinic ambulating without AD. Patient ambulates with fair to slow cadence with step through gait pattern but patient has difficulty in being able to increase her gait speed.  Scanning of visual environment with gait is: fair  Balance: Patient demonstrates difficulty with eyes closed and uneven surfaces as well as with SLS activities.   POSTURAL CONTROL TESTS:   Clinical Test of Sensory Interaction for Balance    (CTSIB):  CONDITION TIME STRATEGY SWAY  Eyes open, firm surface 30 seconds    Eyes closed, firm surface 30 seconds Ankle +1  Eyes open, foam surface 30 seconds ankle +1  Eyes closed, foam surface 30 seconds ankle +3    OCULOMOTOR / VESTIBULAR TESTING:  Oculomotor Exam- Room Light  Normal Abnormal Comments  Ocular Alignment N    Ocular ROM N    Spontaneous Nystagmus N    End-Gaze Nystagmus N    Smooth Pursuit N    Saccades N    VOR     VOR Cancellation N    Left and Right Head Thrust  Abn Inconsistent with head thrust testing on both R & L side and observed several corrective saccades with right head thrust test and 2 with left head thrust   Head Shaking Nystagmus N  Denies dizziness     BPPV TESTS:  Symptoms Duration Intensity Nystagmus  L Dix-Hallpike none N/A N/A none  R Dix-Hallpike Recreates patient's  complaints of dizziness Less than 1 minute "10+"/10 Right upbeating, torsional nystagmus     FUNCTIONAL OUTCOME MEASURES:  Results Comments      ABC Scale 78.8% Falls risk; in need of intervention  DGI 20/24 Falls risk; in need of intervention         [facility] PT Assessment - 01/01/17 1103      Assessment   Medical Diagnosis BPPV   Referring Provider Dr. Kary Kos    Hand Dominance Left   Prior Therapy none     Precautions   Precautions Fall     Restrictions   Weight Bearing Restrictions No     Balance Screen   Has the patient fallen in the past 6 months No   Has the patient had a decrease in activity level because of a fear of falling?  Yes   Is the patient reluctant to leave their home because of a fear of falling?  No     Home Environment   Living Environment Private residence   Living Arrangements Children   Available Help at Discharge Family   Type of Home Other(Comment)  [place] Access Level entry   [place] Two level   Alternate Level Stairs-Number of Steps 18 with left Walker - single point;Walker - 2 wheels;Grab bars - tub/shower;Bedside commode     Prior Function   Level of Independence Independent   Vocation On disability     Cognition   Overall Cognitive Status Within Functional Limits for tasks assessed     Standardized Balance Assessment   Standardized Balance Assessment Dynamic Gait Index     Dynamic Gait Index   Level Surface Normal   Change in Gait Speed Mild Impairment   Gait with Horizontal Head Turns Normal   Gait with Vertical Head Turns Mild Impairment   Gait and Pivot Turn Normal   Step Over Obstacle Mild Impairment   Step Around Obstacles Normal   Steps Mild Impairment   Total Score 20      Canalith Repositioning:  On mat table, performed left Dix-Hallpike testing and it was  negative with no nystagmus observed and patient denied symptoms.  On mat table, performed right Dix-Hallpike testing and it was positive with right upbeating torsional nystagmus of short duration without latency. Performed right canalith repositioning maneuver (CRT). Repeated right CRT for a total of 2 maneuvers with retesting between maneuvers. Patient reported 10 plus/10 dizziness (denied vertigo) with first and second Dix-Hallpike tests and denied dizziness with last Dix-Hallpike test.        PT Education - 01/01/17 0821    Education provided Yes   Education Details discussed BPPV and plan of care; issued BPPV educational information from [employer]- [email or web address].org website   Person(s) Educated Patient   Methods Explanation;Handout;Demonstration   Comprehension Verbalized understanding             PT Long Term Goals - 01/01/17 1005      PT LONG TERM GOAL #1   Title Patient will report 50% or greater improvement in her symptoms of dizziness and imbalance with provoking motions or positions by 02/26/2017.     PT LONG TERM GOAL #2   Title Patient reports no vertigo with provoking motions or positions.     PT LONG TERM GOAL #3   Title Patient will have demonstrate decreased falls risk as indicated by Activities Specific Balance Confidence Scale score of 80% or greater.   Baseline scored 78.8% on 01/01/2017  PT LONG TERM GOAL #4   Title Patient will demonstrate reduced falls risk as evidenced by Dynamic Gait Index (DGI) 22/24 or greater.   Baseline scored 20/24 on 01/01/2017   Time 8   Period Weeks   Status New                Plan - 01/01/17 6294    Clinical Impression Statement Patient with positive right Dix-Hallpike test which potentially is indicative of right posterior canal BPPV. Patient responded well to the canalith repositioning manuever and provided with educational materials on BPPV. Patient scored 20/24 on the DGI indicating falls risk and demonstrates +3 sway with  eyes closed on foam surface condition of the CTSIB test. Patient would benefit from PT services to address patient's subjective symptoms of dizziness and goals as set on plan of care    History and Personal Factors relevant to plan of care: fibromyalgia, migraines, HTN, intermittent episodes of dizziness in the past 2 years, on disability, 1 flight of steps in her home   Clinical Presentation Evolving   Clinical Presentation due to: evolving as patient now reporting mild symptoms outside of the larger episodes of dizziness that she has been experiencing.    Clinical Decision Making Moderate   Rehab Potential Good   Clinical Impairments Affecting Rehab Potential Positive indicators: motivated, family support   Negative indicators: chronicity   PT Frequency 1x / week   PT Duration 8 weeks   PT Treatment/Interventions Vestibular;Canalith Repostioning;Neuromuscular re-education;Balance training;Gait training;Patient/family education   PT Next Visit Plan repeat right Dix-Hallpike test and perform CRT if indicated   PT Home Exercise Plan gave BPPV educational materials   Consulted and Agree with Plan of Care Patient      Patient will benefit from skilled therapeutic intervention in order to improve the following deficits and impairments:  Dizziness, Decreased balance, Difficulty walking  Visit Diagnosis: Dizziness and giddiness - Plan: PT plan of care cert/re-cert      G-Codes - 76/54/65 1358    Functional Assessment Tool Used (Outpatient Only) clinical judgment, CTSIB, DGI, ABC scale   Functional Limitation Mobility: Walking and moving around   Mobility: Walking and Moving Around Current Status (K3546) At least 1 percent but less than 20 percent impaired, limited or restricted   Mobility: Walking and Moving Around Goal Status (F6812) At least 1 percent but less than 20 percent impaired, limited or restricted       Problem List There are no active problems to display for this  patient.  Lady Deutscher PT, DPT [phone] Lady Deutscher 01/01/2017, 1:59 PM  [place] MAIN [facility] SERVICES [address] [place], [place], [address] Phone: [phone]   Fax:  [phone]  Name: Toni Bell MRN: [id number] Date of Birth: 12/30/1950

## 2017-01-05 DIAGNOSIS — J0100 Acute maxillary sinusitis, unspecified: Secondary | ICD-10-CM | POA: Diagnosis not present

## 2017-01-08 ENCOUNTER — Ambulatory Visit: Payer: PPO

## 2017-01-08 VITALS — BP 184/80 | HR 72

## 2017-01-08 DIAGNOSIS — R42 Dizziness and giddiness: Secondary | ICD-10-CM

## 2017-01-08 NOTE — Therapy (Signed)
[place] MAIN [facility] SERVICES [address] [place], [place], [address] Phone: [phone]   Fax:  [phone]  Physical Therapy Treatment  Patient Details  Name: Toni Bell MRN: [id number] Date of Birth: 1950-11-03 Referring Provider: Dr. Kary Kos   Encounter Date: 01/08/2017      PT End of Session - 01/08/17 1358    Visit Number 2   Number of Visits 9   Date for PT Re-Evaluation 02/26/17   PT Start Time 9702   PT Stop Time 1430   PT Time Calculation (min) 38 min   Equipment Utilized During Treatment Gait belt   Activity Tolerance Patient tolerated treatment well   Behavior During Therapy [facility] for tasks assessed/performed      Past Medical History:  Diagnosis Date  . Allergy    103 environmental allergies  . Arthritis   . Depression   . Fibromyalgia   . History of migraine headaches   . Hypertension   . Osteoporosis   . Scarlet fever   . Seizures ([place])    last seizure when pt was 66 years old; reaction to Compazine  . Tinnitus of both ears     Past Surgical History:  Procedure Laterality Date  . BILATERAL CARPAL TUNNEL RELEASE Bilateral 1990, 2014  . [place], 1986   x 3  . COLONOSCOPY    . DENTAL SURGERY  1996   implants 4 on bottom  3 on top  . FOOT SURGERY Right    right great toe  titanium rod in it  . NOSE SURGERY  2002   polyps and bone removed  . REPLACEMENT TOTAL KNEE Bilateral 2015  . TONSILLECTOMY      Vitals:   01/08/17 1355  BP: (!) 184/80  Pulse: 72  SpO2: 97%        Subjective Assessment - 01/08/17 1354    Subjective Pt reports she is doing well on this date. She had a sinus infection and was placed on prednisone earlier this week. She stats that she still has not tried to sleep in her bed. She is still "babying" her symptoms. She reports approximately 85% improvement in her symtpoms. She is anxious to report more improvement because she states she has not "challenged her  symptoms."   Pertinent History Patient reports that at end of 2015 or beginning of 2016 she went in for an xray and when she went to sit up from lying on the table, this is when she had the first major episode of dizziness. Patient felt like she was drunk, weaving, veering and whoozy. Patient denies vertigo when she is having episodes. Patient states that she felt movement around her body and she experienced nausea and vomiting. Patient reports she has had 5 really bad episodes since that time. Patient states she is usually sick in bed each time and states she cannot walk, driving or cook. Patient states she does not feel like is going to fall down, but states she needs to hold onto objects for support. Patient states her symptoms get worse if she lies down flat. Patient states the last episode occurred in May of this year when she went to the [place] [facility] for an internal ultrasound procedure and she experienced an episode while lying down despite being propped on 4 pillows. Patient states she has not been able to sleep in her bed since May 10th. Patient states she has been sleeping on the sofa with large pillows behind  her back so that she is sleeping in near upright position. Patient states she gets an acute headache at the onset of these episodes and states the headache lasts a day or two and states that her blood pressure will then increase as well. Patient reports that she has a history of migraines that began when she was 66 years old. Patient states she got Botox injections for 8 years for the headaches and states she found this to be very helpful and effective. Patient states she stopped getting Botox due to finances. Patient reports that some of her migraine triggers include weather, odors, stress, food and allergies and that she gets migraines separate from these episodes of dizziness. Patient has not been to an ENT physician in regards to her dizziness symptoms. Patient was treated by a neurologist for  9 years in the early 2000s for care of her migraines. Patient states she has not had any recent CT or MRI of brain testing. Patient reports that aggrevating factors are lying down is the only thing that brings on these episodes of intense dizziness for days reports the patient; pt reports that since this most recent episode though she states that head turns, body turns bending over and looking up are now bringing on minor sensation of dizziness, but states it is "very mild" and does not recreate the same intensity of dizziness that she experiences when she has an episode.       Currently in Pain? Yes   Pain Score 10-Worst pain ever   Pain Location Head   Pain Orientation Right;Left   Pain Descriptors / Indicators Constant   Pain Type Chronic pain   Pain Onset Today           TREATMENT   Canalith Repositioning: On mat table, performed left Dix-Hallpike and right Dix-Hallpike testing and they were both negative with no observed nystagmus and no symptoms. Pt is very anxious that she may need one more treatment session for full resolution so taken through one round of CRT to ensure full clearance of debris. Pt held in each position for 2 minutes. Educated pt that there is no need for further intervention when Dix-Hallpike testing is negative and pt is asymptomatic.  Pt provided handout about home Epley maneuver. Extensive education with patient regarding how to perform at home. Discussed indications for when it is necessary to return for additional therapy. Pt is very anxious throughout conversation. Agreed to leave next appointment on the calendar in case her symptoms recur. Encouraged pt to attempt to bring on her symptoms with all aggravating activities before next week. Pt agrees to call and cancel her appointment if she has no further symptoms. Will discharge pt if she remains asymptomatic.                     PT Education - 01/08/17 1358    Education provided Yes   Education  Details Home Epley, eased anxiety regarding symptoms, indications for return to therapy.    Person(s) Educated Patient   Methods Explanation   Comprehension Verbalized understanding             PT Long Term Goals - 01/08/17 1649      PT LONG TERM GOAL #1   Title Patient will report 50% or greater improvement in her symptoms of dizziness and imbalance with provoking motions or positions by 02/26/2017.   Baseline 01/08/17: 85% improvement (no further episodes since initial evaluation)   Time 4   Period  Weeks   Status Achieved     PT LONG TERM GOAL #2   Title Patient reports no vertigo with provoking motions or positions.   Time 8   Period Weeks   Status Achieved     PT LONG TERM GOAL #3   Title Patient will have demonstrate decreased falls risk as indicated by Activities Specific Balance Confidence Scale score of 80% or greater.   Baseline scored 78.8% on 01/01/2017   Time 8   Period Weeks   Status Deferred     PT LONG TERM GOAL #4   Title Patient will demonstrate reduced falls risk as evidenced by Dynamic Gait Index (DGI) 22/24 or greater.   Baseline scored 20/24 on 01/01/2017   Time 8   Period Weeks   Status Deferred               Plan - 02/02/2017 1359    Clinical Impression Statement Pt with negative Dix-Hallpike testing bilaterally on this date. She denies any further episodes of vertigo since the initial evaluation. She was taken through one round of CRT due to anxiety regarding need for multiple treatments to achieve full resoultion of her symptoms. Pt was provided with handout regarding how to perform home Epley. She was encouraged to go home and perform all of the activities that normally bring on her symptoms to ensure complete resolution. She currently reports 85% improvement in her symptoms since she has not tried all of the activities which bring on her symptoms. She would like to keep her next appointment on the calendar in case her symptoms return. Encouraged  her to call to cancel if she has no further symptoms. At this time pt appears to have been treated adequately for her symptoms. She will be discharged if she returns with no further recurrence of her symptoms.    Rehab Potential Good   Clinical Impairments Affecting Rehab Potential Positive indicators: motivated, family support   Negative indicators: chronicity   PT Frequency 1x / week   PT Duration 8 weeks   PT Treatment/Interventions Vestibular;Canalith Repostioning;Neuromuscular re-education;Balance training;Gait training;Patient/family education   PT Next Visit Plan Repeat right Dix-Hallpike test and perform CRT if necessary otherwise discharge.    PT Home Exercise Plan gave [facility] educational materials   Consulted and Agree with Plan of Care Patient      Patient will benefit from skilled therapeutic intervention in order to improve the following deficits and impairments:  Dizziness, Decreased balance, Difficulty walking  Visit Diagnosis: Dizziness and giddiness       G-Codes - 02/02/17 1650    Functional Assessment Tool Used (Outpatient Only) clinical judgment   Functional Limitation Mobility: Walking and moving around   Mobility: Walking and Moving Around Current Status (X9024) At least 1 percent but less than 20 percent impaired, limited or restricted   Mobility: Walking and Moving Around Goal Status (O9735) At least 1 percent but less than 20 percent impaired, limited or restricted      Problem List There are no active problems to display for this patient.  Phillips Grout PT, DPT   Toni Bell Feb 02, 2017, 4:54 PM  [place] MAIN [facility] SERVICES [address] [place], [place], [address] Phone: [phone]   Fax:  [phone]  Name: Toni Bell MRN: [id number] Date of Birth: 1950-09-24

## 2017-01-15 ENCOUNTER — Ambulatory Visit: Payer: PPO | Admitting: Physical Therapy

## 2017-01-19 ENCOUNTER — Encounter: Payer: PPO | Admitting: Physical Therapy

## 2017-01-29 ENCOUNTER — Encounter: Payer: PPO | Admitting: Physical Therapy

## 2017-02-05 ENCOUNTER — Encounter: Payer: Self-pay | Admitting: Physical Therapy

## 2017-02-12 ENCOUNTER — Encounter: Payer: PPO | Admitting: Physical Therapy

## 2017-05-12 DIAGNOSIS — R300 Dysuria: Secondary | ICD-10-CM | POA: Diagnosis not present

## 2017-12-06 DIAGNOSIS — I10 Essential (primary) hypertension: Secondary | ICD-10-CM | POA: Diagnosis not present

## 2017-12-13 DIAGNOSIS — Z0000 Encounter for general adult medical examination without abnormal findings: Secondary | ICD-10-CM | POA: Diagnosis not present

## 2017-12-13 DIAGNOSIS — I10 Essential (primary) hypertension: Secondary | ICD-10-CM | POA: Diagnosis not present

## 2017-12-13 DIAGNOSIS — F319 Bipolar disorder, unspecified: Secondary | ICD-10-CM | POA: Diagnosis not present

## 2017-12-15 ENCOUNTER — Other Ambulatory Visit: Payer: Self-pay | Admitting: Family Medicine

## 2017-12-15 DIAGNOSIS — Z1231 Encounter for screening mammogram for malignant neoplasm of breast: Secondary | ICD-10-CM

## 2018-01-03 ENCOUNTER — Ambulatory Visit
Admission: RE | Admit: 2018-01-03 | Discharge: 2018-01-03 | Disposition: A | Discharge status: Home / Self Care | Payer: PPO | Source: Ambulatory Visit | Attending: Family Medicine | Admitting: Family Medicine

## 2018-01-03 DIAGNOSIS — Z1231 Encounter for screening mammogram for malignant neoplasm of breast: Secondary | ICD-10-CM | POA: Insufficient documentation

## 2018-07-20 DIAGNOSIS — E669 Obesity, unspecified: Secondary | ICD-10-CM | POA: Diagnosis not present

## 2018-07-20 DIAGNOSIS — I10 Essential (primary) hypertension: Secondary | ICD-10-CM | POA: Diagnosis not present

## 2018-07-20 DIAGNOSIS — R112 Nausea with vomiting, unspecified: Secondary | ICD-10-CM | POA: Diagnosis not present

## 2018-07-20 DIAGNOSIS — R42 Dizziness and giddiness: Secondary | ICD-10-CM | POA: Diagnosis not present

## 2019-05-31 DIAGNOSIS — L249 Irritant contact dermatitis, unspecified cause: Secondary | ICD-10-CM | POA: Diagnosis not present

## 2019-06-16 DIAGNOSIS — I10 Essential (primary) hypertension: Secondary | ICD-10-CM | POA: Diagnosis not present

## 2019-06-23 DIAGNOSIS — L509 Urticaria, unspecified: Secondary | ICD-10-CM | POA: Diagnosis not present

## 2019-06-23 DIAGNOSIS — Z0000 Encounter for general adult medical examination without abnormal findings: Secondary | ICD-10-CM | POA: Diagnosis not present

## 2019-06-23 DIAGNOSIS — I10 Essential (primary) hypertension: Secondary | ICD-10-CM | POA: Diagnosis not present

## 2019-07-04 DIAGNOSIS — L509 Urticaria, unspecified: Secondary | ICD-10-CM | POA: Diagnosis not present

## 2019-07-13 DIAGNOSIS — L508 Other urticaria: Secondary | ICD-10-CM | POA: Diagnosis not present

## 2019-08-29 DIAGNOSIS — L501 Idiopathic urticaria: Secondary | ICD-10-CM | POA: Diagnosis not present

## 2020-01-22 ENCOUNTER — Other Ambulatory Visit: Payer: Self-pay | Admitting: Family Medicine

## 2020-01-22 DIAGNOSIS — Z1231 Encounter for screening mammogram for malignant neoplasm of breast: Secondary | ICD-10-CM

## 2020-02-14 ENCOUNTER — Ambulatory Visit
Admission: RE | Admit: 2020-02-14 | Discharge: 2020-02-14 | Disposition: A | Discharge status: Home / Self Care | Payer: PPO | Source: Ambulatory Visit | Attending: Family Medicine | Admitting: Family Medicine

## 2020-02-14 DIAGNOSIS — Z1231 Encounter for screening mammogram for malignant neoplasm of breast: Secondary | ICD-10-CM | POA: Insufficient documentation

## 2020-02-27 DIAGNOSIS — H2513 Age-related nuclear cataract, bilateral: Secondary | ICD-10-CM | POA: Diagnosis not present

## 2020-02-27 DIAGNOSIS — H1045 Other chronic allergic conjunctivitis: Secondary | ICD-10-CM | POA: Diagnosis not present

## 2020-02-27 DIAGNOSIS — H25013 Cortical age-related cataract, bilateral: Secondary | ICD-10-CM | POA: Diagnosis not present

## 2020-03-19 DIAGNOSIS — J302 Other seasonal allergic rhinitis: Secondary | ICD-10-CM | POA: Diagnosis not present

## 2020-03-19 DIAGNOSIS — L501 Idiopathic urticaria: Secondary | ICD-10-CM | POA: Diagnosis not present

## 2020-04-16 DIAGNOSIS — L501 Idiopathic urticaria: Secondary | ICD-10-CM | POA: Diagnosis not present

## 2020-05-14 DIAGNOSIS — L501 Idiopathic urticaria: Secondary | ICD-10-CM | POA: Diagnosis not present

## 2020-05-21 DIAGNOSIS — L501 Idiopathic urticaria: Secondary | ICD-10-CM | POA: Diagnosis not present

## 2020-06-13 DIAGNOSIS — L501 Idiopathic urticaria: Secondary | ICD-10-CM | POA: Diagnosis not present

## 2020-07-09 DIAGNOSIS — I10 Essential (primary) hypertension: Secondary | ICD-10-CM | POA: Diagnosis not present

## 2020-07-11 DIAGNOSIS — L501 Idiopathic urticaria: Secondary | ICD-10-CM | POA: Diagnosis not present

## 2020-07-16 DIAGNOSIS — F419 Anxiety disorder, unspecified: Secondary | ICD-10-CM | POA: Diagnosis not present

## 2020-07-16 DIAGNOSIS — Z0000 Encounter for general adult medical examination without abnormal findings: Secondary | ICD-10-CM | POA: Diagnosis not present

## 2020-07-16 DIAGNOSIS — F319 Bipolar disorder, unspecified: Secondary | ICD-10-CM | POA: Diagnosis not present

## 2020-07-16 DIAGNOSIS — I10 Essential (primary) hypertension: Secondary | ICD-10-CM | POA: Diagnosis not present

## 2020-07-16 DIAGNOSIS — M797 Fibromyalgia: Secondary | ICD-10-CM | POA: Diagnosis not present

## 2020-09-19 DIAGNOSIS — L501 Idiopathic urticaria: Secondary | ICD-10-CM | POA: Diagnosis not present

## 2020-10-24 DIAGNOSIS — L501 Idiopathic urticaria: Secondary | ICD-10-CM | POA: Diagnosis not present

## 2020-11-05 DIAGNOSIS — L501 Idiopathic urticaria: Secondary | ICD-10-CM | POA: Diagnosis not present

## 2020-11-26 DIAGNOSIS — L501 Idiopathic urticaria: Secondary | ICD-10-CM | POA: Diagnosis not present

## 2021-05-26 DIAGNOSIS — R300 Dysuria: Secondary | ICD-10-CM | POA: Diagnosis not present

## 2021-05-26 DIAGNOSIS — N3000 Acute cystitis without hematuria: Secondary | ICD-10-CM | POA: Diagnosis not present

## 2021-07-10 DIAGNOSIS — I10 Essential (primary) hypertension: Secondary | ICD-10-CM | POA: Diagnosis not present

## 2021-07-17 DIAGNOSIS — I10 Essential (primary) hypertension: Secondary | ICD-10-CM | POA: Diagnosis not present

## 2021-07-17 DIAGNOSIS — G43109 Migraine with aura, not intractable, without status migrainosus: Secondary | ICD-10-CM | POA: Diagnosis not present

## 2021-07-17 DIAGNOSIS — Z1211 Encounter for screening for malignant neoplasm of colon: Secondary | ICD-10-CM | POA: Diagnosis not present

## 2021-07-17 DIAGNOSIS — Z0000 Encounter for general adult medical examination without abnormal findings: Secondary | ICD-10-CM | POA: Diagnosis not present

## 2021-07-17 DIAGNOSIS — F439 Reaction to severe stress, unspecified: Secondary | ICD-10-CM | POA: Diagnosis not present

## 2021-07-17 DIAGNOSIS — L508 Other urticaria: Secondary | ICD-10-CM | POA: Diagnosis not present

## 2021-07-17 DIAGNOSIS — Z780 Asymptomatic menopausal state: Secondary | ICD-10-CM | POA: Diagnosis not present

## 2021-07-17 DIAGNOSIS — F319 Bipolar disorder, unspecified: Secondary | ICD-10-CM | POA: Diagnosis not present

## 2021-07-17 DIAGNOSIS — F419 Anxiety disorder, unspecified: Secondary | ICD-10-CM | POA: Diagnosis not present

## 2021-07-17 DIAGNOSIS — E876 Hypokalemia: Secondary | ICD-10-CM | POA: Diagnosis not present

## 2021-07-17 DIAGNOSIS — R197 Diarrhea, unspecified: Secondary | ICD-10-CM | POA: Diagnosis not present

## 2021-09-09 ENCOUNTER — Encounter: Payer: Self-pay | Admitting: Gastroenterology

## 2021-09-10 ENCOUNTER — Encounter: Payer: Self-pay | Admitting: Family Medicine

## 2021-09-19 ENCOUNTER — Encounter: Payer: Self-pay | Admitting: Gastroenterology

## 2021-10-06 DIAGNOSIS — Z780 Asymptomatic menopausal state: Secondary | ICD-10-CM | POA: Diagnosis not present

## 2021-10-19 DIAGNOSIS — R509 Fever, unspecified: Secondary | ICD-10-CM | POA: Diagnosis not present

## 2021-10-19 DIAGNOSIS — R81 Glycosuria: Secondary | ICD-10-CM | POA: Diagnosis not present

## 2021-10-19 DIAGNOSIS — N3001 Acute cystitis with hematuria: Secondary | ICD-10-CM | POA: Diagnosis not present

## 2021-10-21 ENCOUNTER — Other Ambulatory Visit: Payer: Self-pay

## 2021-10-21 ENCOUNTER — Ambulatory Visit (AMBULATORY_SURGERY_CENTER): Payer: PPO | Admitting: *Deleted

## 2021-10-21 VITALS — Ht <= 58 in | Wt 176.0 lb

## 2021-10-21 DIAGNOSIS — Z86010 Personal history of colonic polyps: Secondary | ICD-10-CM

## 2021-10-21 MED ORDER — PEG 3350-KCL-NA BICARB-NACL 420 G PO SOLR: 4000.0000 mL | Freq: Once | ORAL | 0 refills | Status: AC

## 2021-10-21 NOTE — Progress Notes (Signed)

## 2021-10-24 ENCOUNTER — Other Ambulatory Visit: Payer: Self-pay | Admitting: Family Medicine

## 2021-10-24 DIAGNOSIS — Z1231 Encounter for screening mammogram for malignant neoplasm of breast: Secondary | ICD-10-CM

## 2021-11-04 ENCOUNTER — Encounter: Payer: PPO | Admitting: Gastroenterology

## 2021-11-27 ENCOUNTER — Encounter: Payer: Self-pay | Admitting: Gastroenterology

## 2021-12-02 ENCOUNTER — Ambulatory Visit
Admission: RE | Admit: 2021-12-02 | Discharge: 2021-12-02 | Disposition: A | Discharge status: Home / Self Care | Payer: PPO | Source: Ambulatory Visit | Attending: Family Medicine | Admitting: Family Medicine

## 2021-12-02 DIAGNOSIS — Z1231 Encounter for screening mammogram for malignant neoplasm of breast: Secondary | ICD-10-CM | POA: Insufficient documentation

## 2021-12-08 ENCOUNTER — Telehealth: Payer: Self-pay | Admitting: Gastroenterology

## 2021-12-08 NOTE — Telephone Encounter (Signed)
After hours call. Pt taking prep for colonoscopy tomorrow. After part of the first dose around 6 pm she developed chills, fatigue and did not feel able to take any more of the first dose. She has since noted some diarrhea and urinary incontinence since then. No fever, CP, SOB, dysuria.  ? ?Advised her to discontinue the bowel prep. Stay on clear liquids until the morning. Urinary symptoms are not common with prep and she was treated for a UTI in March 2023. Tylenol prn tonight. Contact your PCP or go to the ED if symptoms worsen tonight. Contact your PCP tomorrow to evaluate your chills, urinary symptoms and contact our office tomorrow to check in and to reschedule colonoscopy.  ?

## 2021-12-09 ENCOUNTER — Telehealth: Payer: Self-pay

## 2021-12-09 ENCOUNTER — Other Ambulatory Visit: Payer: Self-pay | Admitting: Family Medicine

## 2021-12-09 ENCOUNTER — Encounter: Payer: PPO | Admitting: Gastroenterology

## 2021-12-09 DIAGNOSIS — R921 Mammographic calcification found on diagnostic imaging of breast: Secondary | ICD-10-CM

## 2021-12-09 DIAGNOSIS — R928 Other abnormal and inconclusive findings on diagnostic imaging of breast: Secondary | ICD-10-CM

## 2021-12-09 NOTE — Telephone Encounter (Signed)
Per Dr. Fuller Plan this patient will not arrive for today's 11:30 procedure.  Attempted to call patient to reschedule procedure.  Left message on voicemail to call back and reschedule. ?

## 2021-12-09 NOTE — Telephone Encounter (Signed)
Patient returned your call. We rescheduled today's procedure to 02/20/22. Patient would like to discuss prep. Please advise. ?

## 2021-12-15 ENCOUNTER — Ambulatory Visit
Admission: RE | Admit: 2021-12-15 | Discharge: 2021-12-15 | Disposition: A | Discharge status: Home / Self Care | Payer: PPO | Source: Ambulatory Visit | Attending: Family Medicine | Admitting: Family Medicine

## 2021-12-15 DIAGNOSIS — R921 Mammographic calcification found on diagnostic imaging of breast: Secondary | ICD-10-CM | POA: Diagnosis not present

## 2021-12-15 DIAGNOSIS — R928 Other abnormal and inconclusive findings on diagnostic imaging of breast: Secondary | ICD-10-CM | POA: Diagnosis not present

## 2022-01-23 ENCOUNTER — Other Ambulatory Visit: Payer: Self-pay

## 2022-01-23 ENCOUNTER — Emergency Department
Admission: EM | Admit: 2022-01-23 | Discharge: 2022-01-23 | Disposition: A | Discharge status: Home / Self Care | Payer: PPO | Attending: Student in an Organized Health Care Education/Training Program | Admitting: Student in an Organized Health Care Education/Training Program

## 2022-01-23 ENCOUNTER — Encounter: Payer: Self-pay | Admitting: Emergency Medicine

## 2022-01-23 ENCOUNTER — Emergency Department: Payer: PPO

## 2022-01-23 DIAGNOSIS — R110 Nausea: Secondary | ICD-10-CM | POA: Insufficient documentation

## 2022-01-23 DIAGNOSIS — K8020 Calculus of gallbladder without cholecystitis without obstruction: Secondary | ICD-10-CM | POA: Diagnosis not present

## 2022-01-23 DIAGNOSIS — N3001 Acute cystitis with hematuria: Secondary | ICD-10-CM | POA: Diagnosis not present

## 2022-01-23 DIAGNOSIS — J9811 Atelectasis: Secondary | ICD-10-CM | POA: Diagnosis not present

## 2022-01-23 DIAGNOSIS — R197 Diarrhea, unspecified: Secondary | ICD-10-CM | POA: Insufficient documentation

## 2022-01-23 DIAGNOSIS — R509 Fever, unspecified: Secondary | ICD-10-CM | POA: Diagnosis not present

## 2022-01-23 DIAGNOSIS — R109 Unspecified abdominal pain: Secondary | ICD-10-CM | POA: Diagnosis not present

## 2022-01-23 LAB — URINALYSIS, ROUTINE W REFLEX MICROSCOPIC
Bacteria, UA: NONE SEEN
Bilirubin Urine: NEGATIVE
Glucose, UA: NEGATIVE mg/dL
Hgb urine dipstick: NEGATIVE
Ketones, ur: NEGATIVE mg/dL
Nitrite: NEGATIVE
Protein, ur: NEGATIVE mg/dL
Specific Gravity, Urine: 1.002 — ABNORMAL LOW (ref 1.005–1.030)
pH: 6 (ref 5.0–8.0)

## 2022-01-23 LAB — COMPREHENSIVE METABOLIC PANEL
ALT: 20 U/L (ref 0–44)
AST: 25 U/L (ref 15–41)
Albumin: 4.1 g/dL (ref 3.5–5.0)
Alkaline Phosphatase: 72 U/L (ref 38–126)
Anion gap: 6 (ref 5–15)
BUN: 10 mg/dL (ref 8–23)
CO2: 22 mmol/L (ref 22–32)
Calcium: 9.5 mg/dL (ref 8.9–10.3)
Chloride: 115 mmol/L — ABNORMAL HIGH (ref 98–111)
Creatinine, Ser: 0.79 mg/dL (ref 0.44–1.00)
GFR, Estimated: >60 mL/min (ref >60)
Glucose, Bld: 122 mg/dL — ABNORMAL HIGH (ref 70–99)
Potassium: 3 mmol/L — ABNORMAL LOW (ref 3.5–5.1)
Sodium: 143 mmol/L (ref 135–145)
Total Bilirubin: 0.9 mg/dL (ref 0.3–1.2)
Total Protein: 7.8 g/dL (ref 6.5–8.1)

## 2022-01-23 LAB — CBC WITH DIFFERENTIAL/PLATELET
Abs Immature Granulocytes: 0.02 10*3/uL (ref 0.00–0.07)
Basophils Absolute: 0.1 10*3/uL (ref 0.0–0.1)
Basophils Relative: 1 %
Eosinophils Absolute: 0.2 10*3/uL (ref 0.0–0.5)
Eosinophils Relative: 3 %
HCT: 40.7 % (ref 36.0–46.0)
Hemoglobin: 14.3 g/dL (ref 12.0–15.0)
Immature Granulocytes: 0 %
Lymphocytes Relative: 27 %
Lymphs Abs: 2 10*3/uL (ref 0.7–4.0)
MCH: 30.8 pg (ref 26.0–34.0)
MCHC: 35.1 g/dL (ref 30.0–36.0)
MCV: 87.5 fL (ref 80.0–100.0)
Monocytes Absolute: 0.6 10*3/uL (ref 0.1–1.0)
Monocytes Relative: 8 %
Neutro Abs: 4.5 10*3/uL (ref 1.7–7.7)
Neutrophils Relative %: 61 %
Platelets: 208 10*3/uL (ref 150–400)
RBC: 4.65 MIL/uL (ref 3.87–5.11)
RDW: 12.8 % (ref 11.5–15.5)
WBC: 7.3 10*3/uL (ref 4.0–10.5)
nRBC: 0 % (ref 0.0–0.2)

## 2022-01-23 LAB — LACTIC ACID, PLASMA: Lactic Acid, Venous: 1.4 mmol/L (ref 0.5–1.9)

## 2022-01-23 MED ORDER — ONDANSETRON HCL 4 MG/2ML IJ SOLN
4.0000 mg | Freq: Once | INTRAMUSCULAR | Status: AC
  Administered 2022-01-23: 4 mg via INTRAVENOUS
  Filled 2022-01-23: qty 2

## 2022-01-23 MED ORDER — ONDANSETRON 4 MG PO TBDP: 4.0000 mg | ORAL_TABLET | Freq: Three times a day (TID) | ORAL | 0 refills | Status: AC | PRN

## 2022-01-23 MED ORDER — SODIUM CHLORIDE 0.9 % IV BOLUS
500.0000 mL | Freq: Once | INTRAVENOUS | Status: AC
  Administered 2022-01-23: 500 mL via INTRAVENOUS

## 2022-01-23 MED ORDER — POTASSIUM CHLORIDE CRYS ER 20 MEQ PO TBCR
40.0000 meq | EXTENDED_RELEASE_TABLET | Freq: Once | ORAL | Status: AC
  Administered 2022-01-23: 40 meq via ORAL
  Filled 2022-01-23: qty 2

## 2022-01-23 MED ORDER — POTASSIUM CHLORIDE CRYS ER 20 MEQ PO TBCR: 20.0000 meq | EXTENDED_RELEASE_TABLET | Freq: Two times a day (BID) | ORAL | 0 refills | Status: AC

## 2022-01-23 NOTE — ED Triage Notes (Signed)
First Nurse Note:  Seen through [facility] today, treated for UTI with Bactrim.  Referred to ED for further evaluation of diarrhea x 1 week, abdominal pain and fever.  AAOx3.  Skin warm and dry. NAD

## 2022-01-23 NOTE — ED Notes (Signed)
Attempted for 22g x1.

## 2022-01-26 ENCOUNTER — Telehealth: Payer: Self-pay | Admitting: Gastroenterology

## 2022-01-26 NOTE — Telephone Encounter (Signed)
Patient was seen in ED on 01/23/2022. Patient is very concerned with everything going on, she is wondering if Dr Servando Snare can go over her ED summary and recent scans of her liver to see if she can be seen any sooner. Patient states that she thinks if she waits until October to be seen then everything could become very severe. Requesting a call back from Dr Servando Snare or his nurse with an update.

## 2022-01-27 DIAGNOSIS — R932 Abnormal findings on diagnostic imaging of liver and biliary tract: Secondary | ICD-10-CM | POA: Diagnosis not present

## 2022-01-27 DIAGNOSIS — R5383 Other fatigue: Secondary | ICD-10-CM | POA: Diagnosis not present

## 2022-01-27 DIAGNOSIS — R140 Abdominal distension (gaseous): Secondary | ICD-10-CM | POA: Diagnosis not present

## 2022-01-27 DIAGNOSIS — E876 Hypokalemia: Secondary | ICD-10-CM | POA: Diagnosis not present

## 2022-01-27 DIAGNOSIS — M545 Low back pain, unspecified: Secondary | ICD-10-CM | POA: Diagnosis not present

## 2022-01-27 DIAGNOSIS — R197 Diarrhea, unspecified: Secondary | ICD-10-CM | POA: Diagnosis not present

## 2022-01-27 DIAGNOSIS — R5381 Other malaise: Secondary | ICD-10-CM | POA: Diagnosis not present

## 2022-01-28 LAB — CULTURE, BLOOD (ROUTINE X 2)
Culture: NO GROWTH
Culture: NO GROWTH
Special Requests: ADEQUATE

## 2022-01-29 ENCOUNTER — Other Ambulatory Visit: Payer: Self-pay | Admitting: Gastroenterology

## 2022-01-29 DIAGNOSIS — K529 Noninfective gastroenteritis and colitis, unspecified: Secondary | ICD-10-CM | POA: Diagnosis not present

## 2022-01-29 DIAGNOSIS — M797 Fibromyalgia: Secondary | ICD-10-CM | POA: Diagnosis not present

## 2022-01-29 DIAGNOSIS — R197 Diarrhea, unspecified: Secondary | ICD-10-CM | POA: Diagnosis not present

## 2022-01-29 DIAGNOSIS — R531 Weakness: Secondary | ICD-10-CM | POA: Diagnosis not present

## 2022-01-29 DIAGNOSIS — R159 Full incontinence of feces: Secondary | ICD-10-CM | POA: Diagnosis not present

## 2022-01-29 DIAGNOSIS — K7469 Other cirrhosis of liver: Secondary | ICD-10-CM | POA: Diagnosis not present

## 2022-01-29 DIAGNOSIS — R5381 Other malaise: Secondary | ICD-10-CM | POA: Diagnosis not present

## 2022-01-29 DIAGNOSIS — R5383 Other fatigue: Secondary | ICD-10-CM | POA: Diagnosis not present

## 2022-01-29 DIAGNOSIS — K769 Liver disease, unspecified: Secondary | ICD-10-CM

## 2022-01-29 DIAGNOSIS — R32 Unspecified urinary incontinence: Secondary | ICD-10-CM | POA: Diagnosis not present

## 2022-01-30 ENCOUNTER — Ambulatory Visit
Admission: RE | Admit: 2022-01-30 | Discharge: 2022-01-30 | Disposition: A | Discharge status: Home / Self Care | Payer: PPO | Source: Ambulatory Visit | Attending: Gastroenterology | Admitting: Gastroenterology

## 2022-01-30 DIAGNOSIS — K769 Liver disease, unspecified: Secondary | ICD-10-CM | POA: Insufficient documentation

## 2022-01-30 DIAGNOSIS — R197 Diarrhea, unspecified: Secondary | ICD-10-CM | POA: Diagnosis not present

## 2022-01-30 DIAGNOSIS — K529 Noninfective gastroenteritis and colitis, unspecified: Secondary | ICD-10-CM | POA: Diagnosis not present

## 2022-01-30 DIAGNOSIS — K449 Diaphragmatic hernia without obstruction or gangrene: Secondary | ICD-10-CM | POA: Diagnosis not present

## 2022-01-30 DIAGNOSIS — K7469 Other cirrhosis of liver: Secondary | ICD-10-CM | POA: Insufficient documentation

## 2022-01-30 DIAGNOSIS — K760 Fatty (change of) liver, not elsewhere classified: Secondary | ICD-10-CM | POA: Diagnosis not present

## 2022-01-30 DIAGNOSIS — K8020 Calculus of gallbladder without cholecystitis without obstruction: Secondary | ICD-10-CM | POA: Diagnosis not present

## 2022-01-30 MED ORDER — GADOBUTROL 1 MMOL/ML IV SOLN
7.5000 mL | Freq: Once | INTRAVENOUS | Status: AC | PRN
  Administered 2022-01-30: 7.5 mL via INTRAVENOUS

## 2022-02-02 DIAGNOSIS — R197 Diarrhea, unspecified: Secondary | ICD-10-CM | POA: Diagnosis not present

## 2022-02-02 DIAGNOSIS — K529 Noninfective gastroenteritis and colitis, unspecified: Secondary | ICD-10-CM | POA: Diagnosis not present

## 2022-02-02 NOTE — Telephone Encounter (Signed)
Left message on voicemail Pt was seen by [facility] last week... I let pt know that her upcoming appt will be canceled, she is being evaluated by [facility] and previous pt of [facility]

## 2022-02-13 DIAGNOSIS — K760 Fatty (change of) liver, not elsewhere classified: Secondary | ICD-10-CM | POA: Diagnosis not present

## 2022-02-13 DIAGNOSIS — K7689 Other specified diseases of liver: Secondary | ICD-10-CM | POA: Diagnosis not present

## 2022-02-13 DIAGNOSIS — M797 Fibromyalgia: Secondary | ICD-10-CM | POA: Diagnosis not present

## 2022-02-13 DIAGNOSIS — R5381 Other malaise: Secondary | ICD-10-CM | POA: Diagnosis not present

## 2022-02-13 DIAGNOSIS — R5383 Other fatigue: Secondary | ICD-10-CM | POA: Diagnosis not present

## 2022-02-20 ENCOUNTER — Encounter: Payer: PPO | Admitting: Gastroenterology

## 2022-06-02 ENCOUNTER — Ambulatory Visit: Payer: PPO | Admitting: Gastroenterology

## 2022-07-14 DIAGNOSIS — I10 Essential (primary) hypertension: Secondary | ICD-10-CM | POA: Diagnosis not present

## 2022-07-21 DIAGNOSIS — Z1211 Encounter for screening for malignant neoplasm of colon: Secondary | ICD-10-CM | POA: Diagnosis not present

## 2022-07-21 DIAGNOSIS — G8929 Other chronic pain: Secondary | ICD-10-CM | POA: Diagnosis not present

## 2022-07-21 DIAGNOSIS — M79645 Pain in left finger(s): Secondary | ICD-10-CM | POA: Diagnosis not present

## 2022-07-21 DIAGNOSIS — F419 Anxiety disorder, unspecified: Secondary | ICD-10-CM | POA: Diagnosis not present

## 2022-07-21 DIAGNOSIS — E876 Hypokalemia: Secondary | ICD-10-CM | POA: Diagnosis not present

## 2022-07-21 DIAGNOSIS — M797 Fibromyalgia: Secondary | ICD-10-CM | POA: Diagnosis not present

## 2022-07-21 DIAGNOSIS — G43909 Migraine, unspecified, not intractable, without status migrainosus: Secondary | ICD-10-CM | POA: Diagnosis not present

## 2022-07-21 DIAGNOSIS — K529 Noninfective gastroenteritis and colitis, unspecified: Secondary | ICD-10-CM | POA: Diagnosis not present

## 2022-07-21 DIAGNOSIS — Z0000 Encounter for general adult medical examination without abnormal findings: Secondary | ICD-10-CM | POA: Diagnosis not present

## 2022-07-21 DIAGNOSIS — M79644 Pain in right finger(s): Secondary | ICD-10-CM | POA: Diagnosis not present

## 2022-07-21 DIAGNOSIS — F319 Bipolar disorder, unspecified: Secondary | ICD-10-CM | POA: Diagnosis not present

## 2022-07-21 DIAGNOSIS — I10 Essential (primary) hypertension: Secondary | ICD-10-CM | POA: Diagnosis not present

## 2022-12-07 DIAGNOSIS — M79645 Pain in left finger(s): Secondary | ICD-10-CM | POA: Diagnosis not present

## 2022-12-07 DIAGNOSIS — M79644 Pain in right finger(s): Secondary | ICD-10-CM | POA: Diagnosis not present

## 2022-12-07 DIAGNOSIS — M1811 Unilateral primary osteoarthritis of first carpometacarpal joint, right hand: Secondary | ICD-10-CM | POA: Diagnosis not present

## 2022-12-07 DIAGNOSIS — M65342 Trigger finger, left ring finger: Secondary | ICD-10-CM | POA: Diagnosis not present

## 2022-12-09 IMAGING — MG MM DIGITAL DIAGNOSTIC UNILAT*L* W/ TOMO W/ CAD
4 series · 4 of 8 positions shown · non-contrast
Comparison: Previous exam(s).

CLINICAL DATA: 70-year-old female presenting for 5 callback of left
breast calcifications.

EXAM:
DIGITAL DIAGNOSTIC UNILATERAL LEFT MAMMOGRAM WITH TOMOSYNTHESIS AND
CAD
TECHNIQUE: Left digital diagnostic mammography and breast tomosynthesis was
performed. The images were evaluated with computer-aided detection.

[L ML]
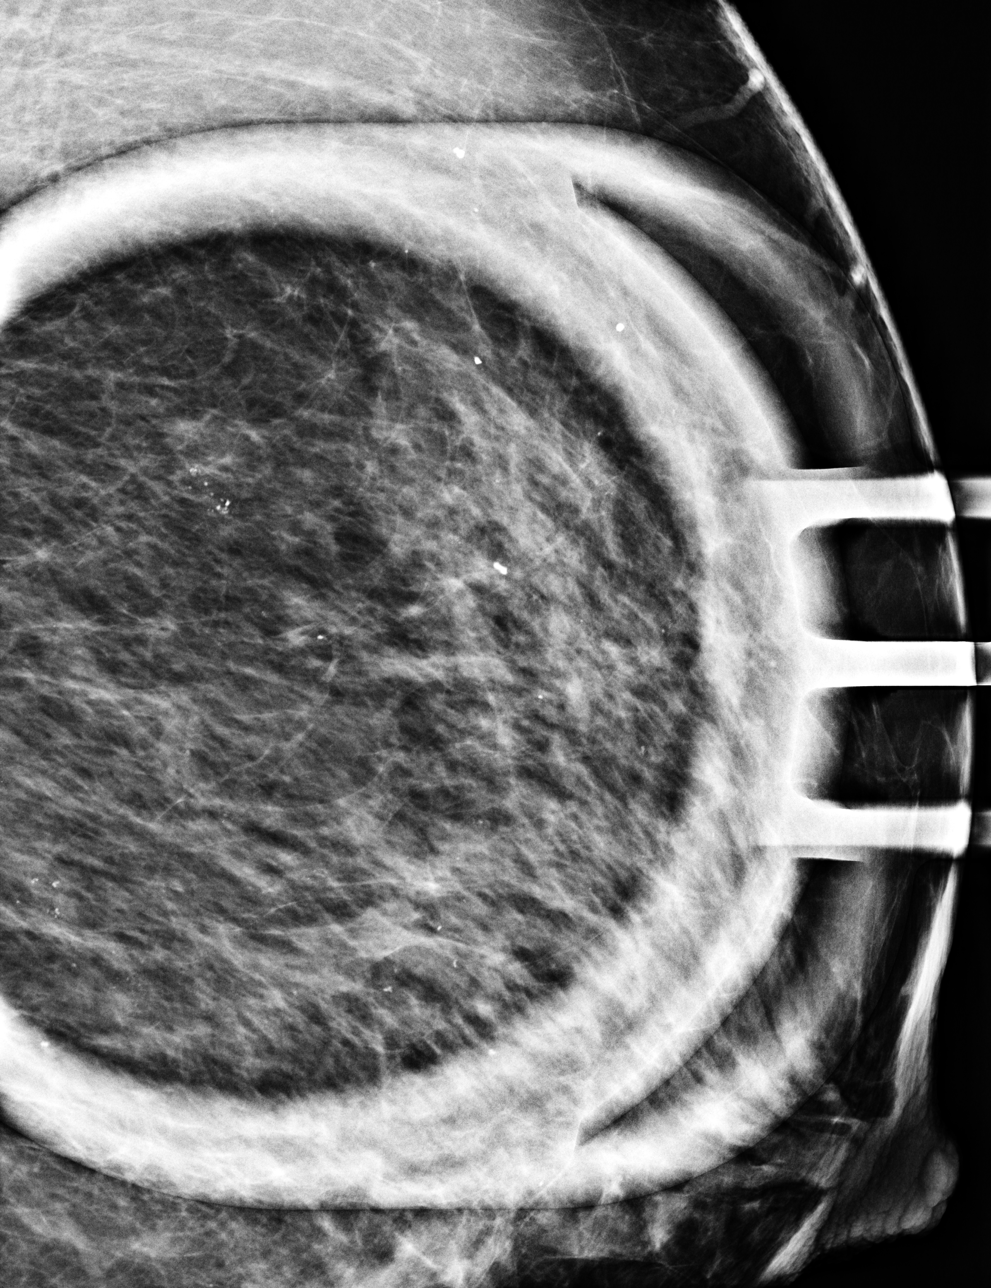

[L CC]
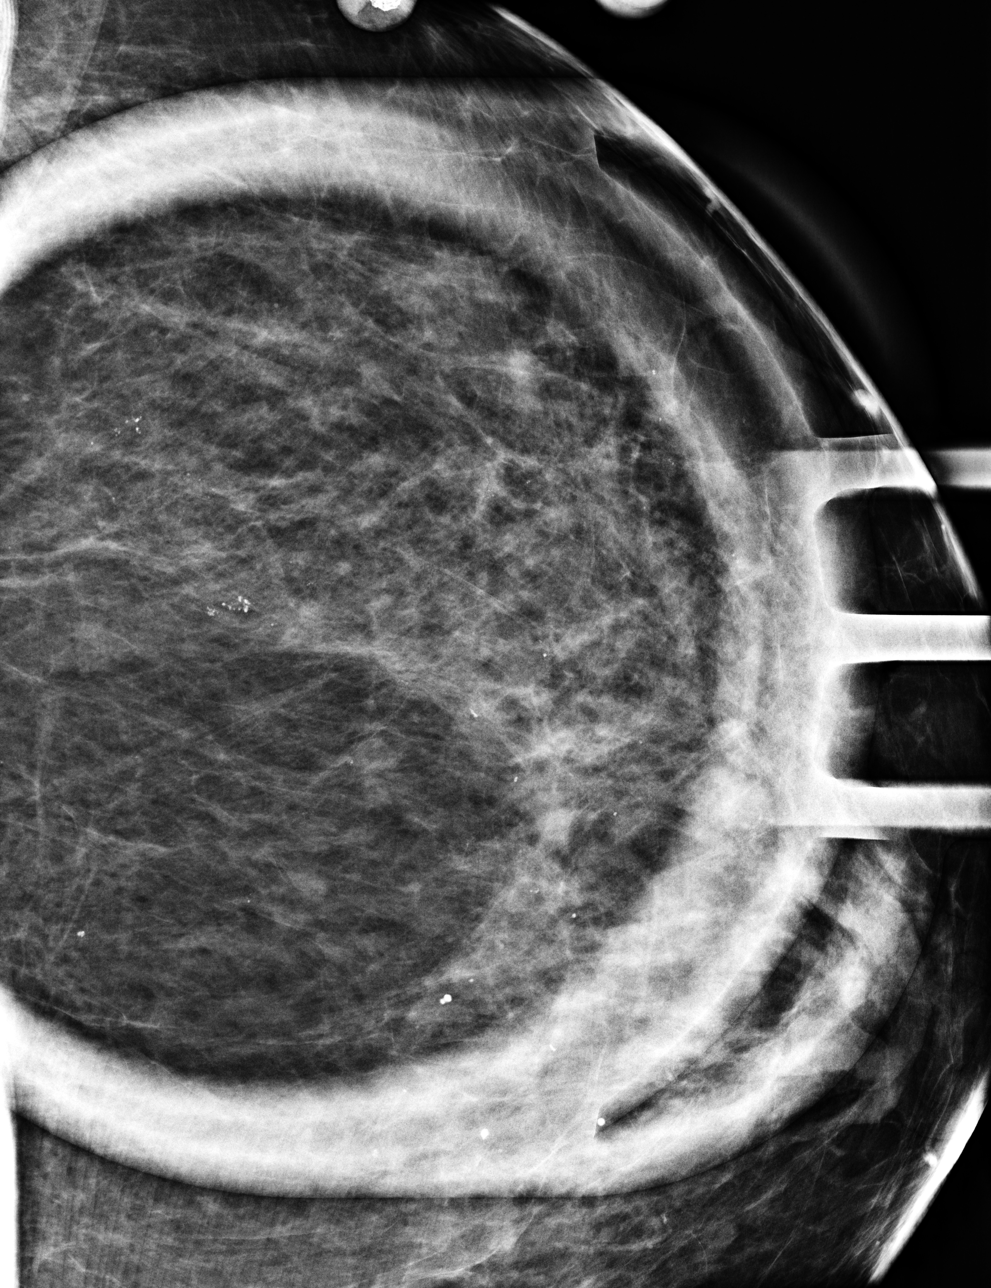

[L ML synth-2D]
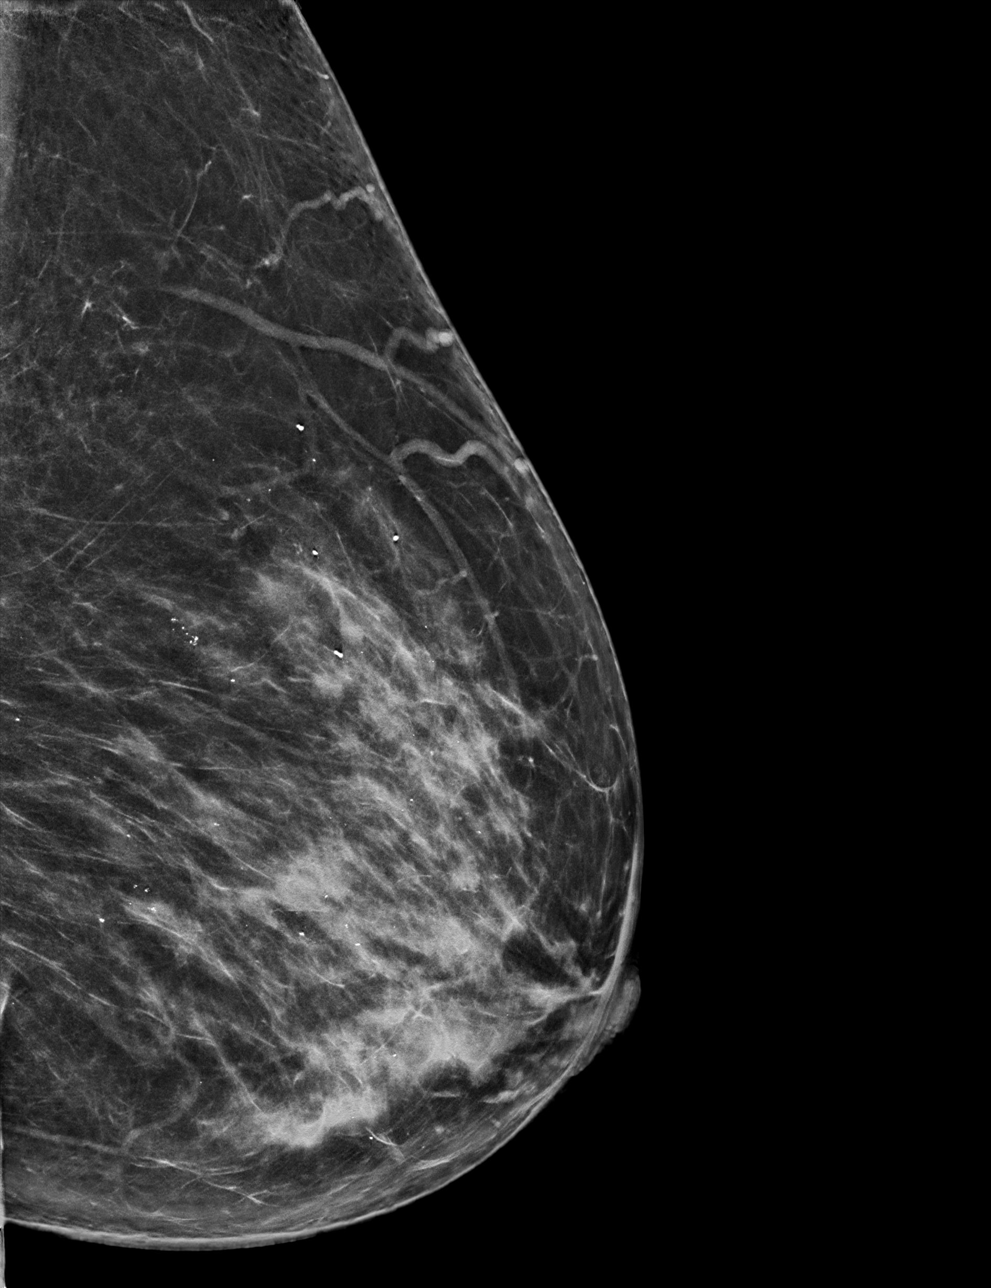

[L ML tomo · tomo slice 37/74.0]
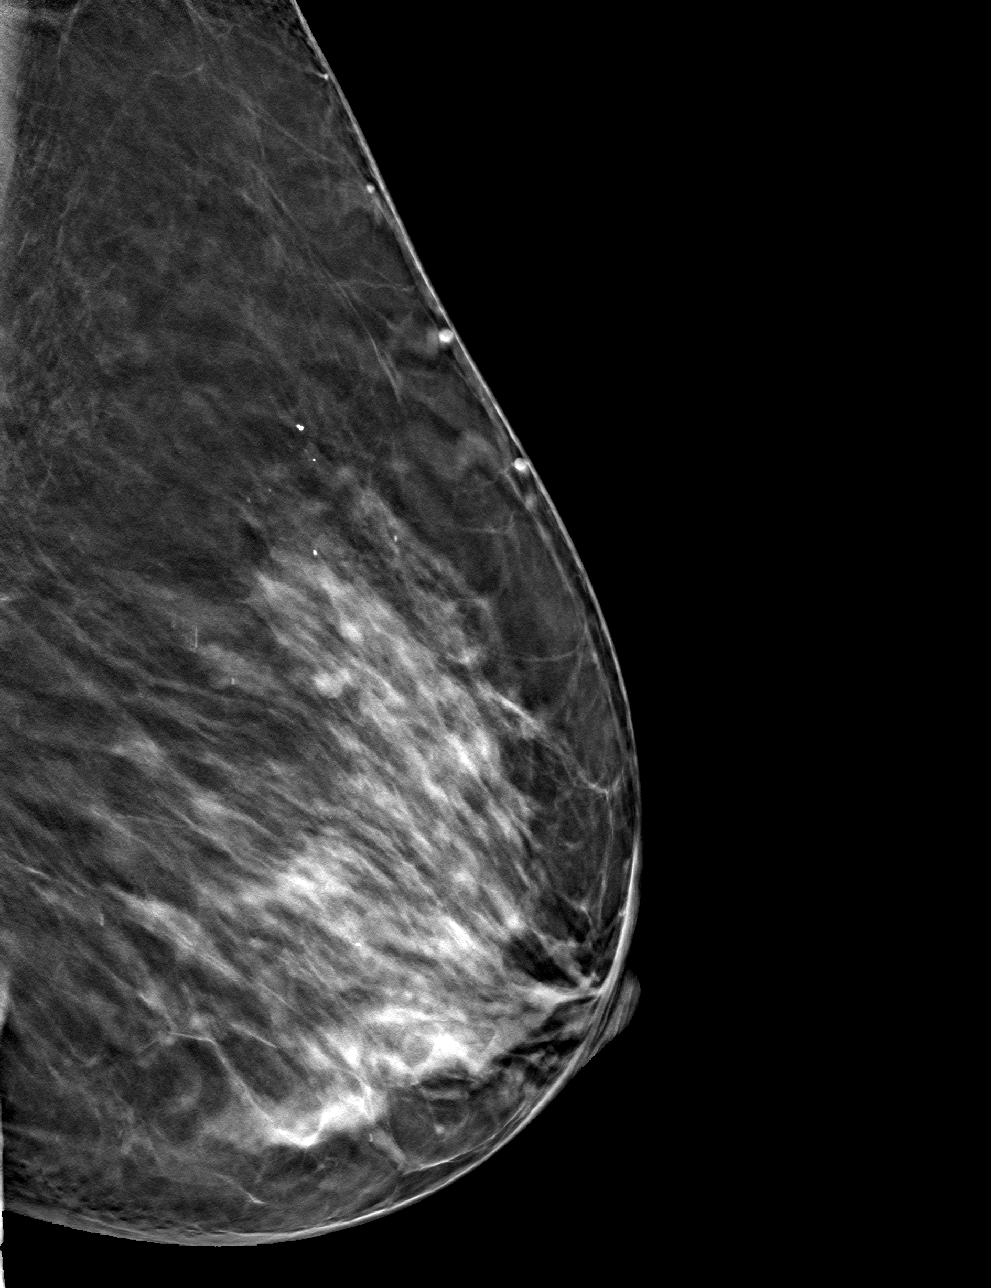

[4 of 8 positions shown; findings below may reference images not displayed]

ACR Breast Density Category c: The breast tissue is heterogeneously
dense, which may obscure small masses.
FINDINGS: Spot compression magnification images of the upper-outer left breast
demonstrates a 6 mm group coarse heterogeneous calcifications. These
have a similar appearance to other calcifications which are loosely
scattered throughout both breasts.
IMPRESSION: There is a likely benign 6 mm group of calcifications in the
upper-outer left breast.

RECOMMENDATION:
Six-month follow-up left breast mammogram.

I have discussed the findings and recommendations with the patient.
If applicable, a reminder letter will be sent to the patient
regarding the next appointment.

BI-RADS CATEGORY  3: Probably benign.

## 2022-12-14 DIAGNOSIS — R300 Dysuria: Secondary | ICD-10-CM | POA: Diagnosis not present

## 2022-12-14 DIAGNOSIS — N3001 Acute cystitis with hematuria: Secondary | ICD-10-CM | POA: Diagnosis not present

## 2022-12-18 DIAGNOSIS — M65342 Trigger finger, left ring finger: Secondary | ICD-10-CM | POA: Diagnosis not present

## 2023-01-15 DIAGNOSIS — H2513 Age-related nuclear cataract, bilateral: Secondary | ICD-10-CM | POA: Diagnosis not present

## 2023-01-15 DIAGNOSIS — H43393 Other vitreous opacities, bilateral: Secondary | ICD-10-CM | POA: Diagnosis not present

## 2023-01-15 DIAGNOSIS — H25013 Cortical age-related cataract, bilateral: Secondary | ICD-10-CM | POA: Diagnosis not present

## 2023-01-15 DIAGNOSIS — H5203 Hypermetropia, bilateral: Secondary | ICD-10-CM | POA: Diagnosis not present

## 2023-01-15 DIAGNOSIS — H1045 Other chronic allergic conjunctivitis: Secondary | ICD-10-CM | POA: Diagnosis not present

## 2023-07-07 DIAGNOSIS — N3000 Acute cystitis without hematuria: Secondary | ICD-10-CM | POA: Diagnosis not present

## 2023-07-07 DIAGNOSIS — R300 Dysuria: Secondary | ICD-10-CM | POA: Diagnosis not present

## 2023-07-20 DIAGNOSIS — I10 Essential (primary) hypertension: Secondary | ICD-10-CM | POA: Diagnosis not present

## 2023-07-27 DIAGNOSIS — Z1331 Encounter for screening for depression: Secondary | ICD-10-CM | POA: Diagnosis not present

## 2023-07-27 DIAGNOSIS — M797 Fibromyalgia: Secondary | ICD-10-CM | POA: Diagnosis not present

## 2023-07-27 DIAGNOSIS — R7301 Impaired fasting glucose: Secondary | ICD-10-CM | POA: Diagnosis not present

## 2023-07-27 DIAGNOSIS — G43909 Migraine, unspecified, not intractable, without status migrainosus: Secondary | ICD-10-CM | POA: Diagnosis not present

## 2023-07-27 DIAGNOSIS — F319 Bipolar disorder, unspecified: Secondary | ICD-10-CM | POA: Diagnosis not present

## 2023-07-27 DIAGNOSIS — N1831 Chronic kidney disease, stage 3a: Secondary | ICD-10-CM | POA: Diagnosis not present

## 2023-07-27 DIAGNOSIS — I10 Essential (primary) hypertension: Secondary | ICD-10-CM | POA: Diagnosis not present

## 2023-07-27 DIAGNOSIS — K7469 Other cirrhosis of liver: Secondary | ICD-10-CM | POA: Diagnosis not present

## 2023-07-27 DIAGNOSIS — K529 Noninfective gastroenteritis and colitis, unspecified: Secondary | ICD-10-CM | POA: Diagnosis not present

## 2023-07-27 DIAGNOSIS — Z0000 Encounter for general adult medical examination without abnormal findings: Secondary | ICD-10-CM | POA: Diagnosis not present

## 2023-07-27 DIAGNOSIS — R7303 Prediabetes: Secondary | ICD-10-CM | POA: Diagnosis not present

## 2023-08-06 ENCOUNTER — Other Ambulatory Visit: Payer: Self-pay | Admitting: Family Medicine

## 2023-08-06 DIAGNOSIS — R921 Mammographic calcification found on diagnostic imaging of breast: Secondary | ICD-10-CM

## 2023-08-24 ENCOUNTER — Other Ambulatory Visit: Payer: PPO

## 2023-10-12 ENCOUNTER — Inpatient Hospital Stay: Admission: RE | Admit: 2023-10-12 | Payer: PPO | Source: Ambulatory Visit

## 2023-10-18 DIAGNOSIS — R7301 Impaired fasting glucose: Secondary | ICD-10-CM | POA: Diagnosis not present

## 2023-10-25 DIAGNOSIS — E876 Hypokalemia: Secondary | ICD-10-CM | POA: Diagnosis not present

## 2023-10-25 DIAGNOSIS — N1831 Chronic kidney disease, stage 3a: Secondary | ICD-10-CM | POA: Diagnosis not present

## 2023-10-25 DIAGNOSIS — F319 Bipolar disorder, unspecified: Secondary | ICD-10-CM | POA: Diagnosis not present

## 2023-10-25 DIAGNOSIS — K7469 Other cirrhosis of liver: Secondary | ICD-10-CM | POA: Diagnosis not present

## 2023-10-25 DIAGNOSIS — I10 Essential (primary) hypertension: Secondary | ICD-10-CM | POA: Diagnosis not present

## 2023-10-25 DIAGNOSIS — M797 Fibromyalgia: Secondary | ICD-10-CM | POA: Diagnosis not present

## 2023-10-25 DIAGNOSIS — G43109 Migraine with aura, not intractable, without status migrainosus: Secondary | ICD-10-CM | POA: Diagnosis not present

## 2023-11-01 DIAGNOSIS — E876 Hypokalemia: Secondary | ICD-10-CM | POA: Diagnosis not present

## 2024-02-17 DIAGNOSIS — N1831 Chronic kidney disease, stage 3a: Secondary | ICD-10-CM | POA: Diagnosis not present

## 2024-02-24 DIAGNOSIS — I10 Essential (primary) hypertension: Secondary | ICD-10-CM | POA: Diagnosis not present

## 2024-02-24 DIAGNOSIS — G43109 Migraine with aura, not intractable, without status migrainosus: Secondary | ICD-10-CM | POA: Diagnosis not present

## 2024-02-24 DIAGNOSIS — M797 Fibromyalgia: Secondary | ICD-10-CM | POA: Diagnosis not present

## 2024-02-24 DIAGNOSIS — N1831 Chronic kidney disease, stage 3a: Secondary | ICD-10-CM | POA: Diagnosis not present

## 2024-03-30 ENCOUNTER — Encounter

## 2024-04-06 ENCOUNTER — Other Ambulatory Visit: Payer: Self-pay

## 2024-04-06 MED ORDER — POTASSIUM CHLORIDE ER 10 MEQ PO TBCR
10.0000 meq | EXTENDED_RELEASE_TABLET | Freq: Every day | ORAL | 3 refills | Status: AC
  Filled 2024-04-06: qty 90, 90d supply, fill #0
  Filled 2024-07-03: qty 90, 90d supply, fill #1

## 2024-04-06 MED ORDER — CLONAZEPAM 1 MG PO TABS
1.0000 mg | ORAL_TABLET | Freq: Every day | ORAL | 0 refills | Status: DC | PRN
  Filled 2024-04-06 – 2024-04-11 (×3): qty 90, 90d supply, fill #0

## 2024-04-07 ENCOUNTER — Other Ambulatory Visit: Payer: Self-pay

## 2024-04-07 MED ORDER — FLUOXETINE HCL 20 MG PO CAPS
20.0000 mg | ORAL_CAPSULE | Freq: Every day | ORAL | 3 refills | Status: AC
  Filled 2024-04-11 – 2024-04-14 (×2): qty 90, 90d supply, fill #0
  Filled 2024-07-03: qty 90, 90d supply, fill #1

## 2024-04-07 MED ORDER — GABAPENTIN 100 MG PO CAPS
ORAL_CAPSULE | ORAL | 3 refills | Status: AC
  Filled 2024-04-14: qty 450, 90d supply, fill #0
  Filled 2024-07-03: qty 450, 90d supply, fill #1

## 2024-04-07 MED ORDER — BACLOFEN 10 MG PO TABS
10.0000 mg | ORAL_TABLET | Freq: Three times a day (TID) | ORAL | 3 refills | Status: AC
  Filled 2024-04-11 – 2024-04-14 (×2): qty 270, 90d supply, fill #0
  Filled 2024-07-03: qty 270, 90d supply, fill #1

## 2024-04-07 MED ORDER — QUETIAPINE FUMARATE 100 MG PO TABS
150.0000 mg | ORAL_TABLET | Freq: Every day | ORAL | 3 refills | Status: AC
  Filled 2024-04-11 – 2024-04-14 (×2): qty 135, 90d supply, fill #0
  Filled 2024-07-03: qty 135, 90d supply, fill #1

## 2024-04-07 MED ORDER — BISOPROLOL-HYDROCHLOROTHIAZIDE 2.5-6.25 MG PO TABS
1.0000 | ORAL_TABLET | Freq: Every day | ORAL | 3 refills | Status: DC
  Filled 2024-04-11 – 2024-04-14 (×2): qty 90, 90d supply, fill #0
  Filled 2024-07-03: qty 90, 90d supply, fill #1

## 2024-04-07 MED ORDER — TOPIRAMATE 100 MG PO TABS
100.0000 mg | ORAL_TABLET | Freq: Every day | ORAL | 3 refills | Status: AC
  Filled 2024-04-11 – 2024-04-14 (×2): qty 90, 90d supply, fill #0
  Filled 2024-07-03: qty 90, 90d supply, fill #1

## 2024-04-10 ENCOUNTER — Other Ambulatory Visit: Payer: Self-pay

## 2024-04-10 DIAGNOSIS — M62838 Other muscle spasm: Secondary | ICD-10-CM | POA: Diagnosis not present

## 2024-04-10 DIAGNOSIS — M791 Myalgia, unspecified site: Secondary | ICD-10-CM | POA: Diagnosis not present

## 2024-04-10 MED ORDER — DICLOFENAC SODIUM 1 % EX GEL
2.0000 g | Freq: Four times a day (QID) | CUTANEOUS | 11 refills | Status: AC
  Filled 2024-04-10: qty 200, 25d supply, fill #0

## 2024-04-11 ENCOUNTER — Other Ambulatory Visit: Payer: Self-pay

## 2024-04-12 ENCOUNTER — Inpatient Hospital Stay: Admission: RE | Admit: 2024-04-12 | Source: Ambulatory Visit

## 2024-04-13 ENCOUNTER — Other Ambulatory Visit: Payer: Self-pay

## 2024-04-14 ENCOUNTER — Other Ambulatory Visit: Payer: Self-pay

## 2024-06-09 ENCOUNTER — Other Ambulatory Visit: Payer: Self-pay

## 2024-07-03 ENCOUNTER — Other Ambulatory Visit: Payer: Self-pay

## 2024-07-03 MED ORDER — CLONAZEPAM 1 MG PO TABS
1.0000 mg | ORAL_TABLET | Freq: Every day | ORAL | 1 refills | Status: AC | PRN
  Filled 2024-07-03 – 2024-07-18 (×2): qty 90, 90d supply, fill #0

## 2024-07-18 ENCOUNTER — Other Ambulatory Visit: Payer: Self-pay

## 2024-08-02 NOTE — Progress Notes (Signed)
 Toni Bell                                          MRN: [id number]   08/02/2024   The [facility] Quality Team Specialist reviewed this patient medical record for the purposes of chart review for care gap closure. The following were reviewed: chart review for care gap closure-controlling blood pressure.    [facility] Quality Team

## 2024-08-07 ENCOUNTER — Other Ambulatory Visit: Payer: Self-pay

## 2024-08-07 MED ORDER — BISOPROLOL-HYDROCHLOROTHIAZIDE 2.5-6.25 MG PO TABS
1.0000 | ORAL_TABLET | Freq: Every day | ORAL | 3 refills | Status: AC
  Filled 2024-08-07: qty 90, 90d supply, fill #0
